# Patient Record
Sex: Female | Born: 1980 | Race: Black or African American | Hispanic: No | Marital: Single | Smoking: Never smoker
Health system: Southern US, Community
[De-identification: ages and names within clinical notes are randomized; demographics above are authoritative.]

## PROBLEM LIST (undated history)

## (undated) DIAGNOSIS — D281 Benign neoplasm of vagina: Secondary | ICD-10-CM

## (undated) DIAGNOSIS — D649 Anemia, unspecified: Secondary | ICD-10-CM

## (undated) DIAGNOSIS — G43909 Migraine, unspecified, not intractable, without status migrainosus: Secondary | ICD-10-CM

## (undated) DIAGNOSIS — R7303 Prediabetes: Secondary | ICD-10-CM

## (undated) HISTORY — PX: DIAGNOSTIC LAPAROSCOPY: SUR761

## (undated) HISTORY — PX: WISDOM TOOTH EXTRACTION: SHX21

---

## 2009-03-13 ENCOUNTER — Inpatient Hospital Stay (HOSPITAL_COMMUNITY): Admission: AD | Admit: 2009-03-13 | Discharge: 2009-03-13 | Payer: Self-pay | Admitting: Obstetrics & Gynecology

## 2009-03-16 ENCOUNTER — Inpatient Hospital Stay (HOSPITAL_COMMUNITY): Admission: RE | Admit: 2009-03-16 | Discharge: 2009-03-16 | Payer: Self-pay | Admitting: Obstetrics & Gynecology

## 2009-03-16 ENCOUNTER — Ambulatory Visit: Payer: Self-pay | Admitting: Obstetrics & Gynecology

## 2009-03-20 ENCOUNTER — Encounter: Payer: Self-pay | Admitting: Obstetrics & Gynecology

## 2009-03-20 ENCOUNTER — Observation Stay (HOSPITAL_COMMUNITY): Admission: AD | Admit: 2009-03-20 | Discharge: 2009-03-21 | Payer: Self-pay | Admitting: Obstetrics & Gynecology

## 2009-03-20 ENCOUNTER — Ambulatory Visit: Payer: Self-pay | Admitting: Obstetrics & Gynecology

## 2009-03-23 ENCOUNTER — Inpatient Hospital Stay (HOSPITAL_COMMUNITY): Admission: AD | Admit: 2009-03-23 | Discharge: 2009-03-23 | Payer: Self-pay | Admitting: Obstetrics and Gynecology

## 2009-04-02 DEATH — deceased

## 2009-04-09 ENCOUNTER — Ambulatory Visit: Payer: Self-pay | Admitting: Obstetrics & Gynecology

## 2009-04-09 LAB — CONVERTED CEMR LAB: Yeast Wet Prep HPF POC: NONE SEEN

## 2010-03-28 LAB — URINALYSIS, ROUTINE W REFLEX MICROSCOPIC
Bilirubin Urine: NEGATIVE
Glucose, UA: NEGATIVE mg/dL
Nitrite: NEGATIVE
Protein, ur: NEGATIVE mg/dL
Specific Gravity, Urine: 1.025 (ref 1.005–1.030)
pH: 6 (ref 5.0–8.0)

## 2010-03-28 LAB — HCG, QUANTITATIVE, PREGNANCY
hCG, Beta Chain, Quant, S: 2721 m[IU]/mL — ABNORMAL HIGH (ref <5)
hCG, Beta Chain, Quant, S: 4981 m[IU]/mL — ABNORMAL HIGH (ref <5)

## 2010-03-28 LAB — CBC
HCT: 32 % — ABNORMAL LOW (ref 36.0–46.0)
Hemoglobin: 10.4 g/dL — ABNORMAL LOW (ref 12.0–15.0)
MCHC: 32.8 g/dL (ref 30.0–36.0)
MCV: 87.5 fL (ref 78.0–100.0)
MCV: 87.5 fL (ref 78.0–100.0)
Platelets: 135 10*3/uL — ABNORMAL LOW (ref 150–400)
Platelets: 143 10*3/uL — ABNORMAL LOW (ref 150–400)
Platelets: 155 10*3/uL (ref 150–400)
RDW: 14.1 % (ref 11.5–15.5)
RDW: 14.5 % (ref 11.5–15.5)
RDW: 14.7 % (ref 11.5–15.5)
WBC: 7.3 10*3/uL (ref 4.0–10.5)

## 2010-03-28 LAB — CROSSMATCH

## 2010-03-28 LAB — POCT PREGNANCY, URINE: Preg Test, Ur: POSITIVE

## 2010-03-28 LAB — RH IMMUNE GLOBULIN WORKUP (NOT WOMEN'S HOSP): ABO/RH(D): AB NEG

## 2010-03-28 LAB — ABO/RH: ABO/RH(D): AB NEG

## 2010-03-28 LAB — DIFFERENTIAL
Basophils Relative: 1 % (ref 0–1)
Eosinophils Absolute: 0.1 10*3/uL (ref 0.0–0.7)
Lymphocytes Relative: 36 % (ref 12–46)
Lymphs Abs: 2.6 10*3/uL (ref 0.7–4.0)
Monocytes Relative: 6 % (ref 3–12)

## 2010-03-28 LAB — URINE MICROSCOPIC-ADD ON

## 2010-03-28 LAB — AST: AST: 18 U/L (ref 0–37)

## 2010-03-28 LAB — BUN: BUN: 9 mg/dL (ref 6–23)

## 2010-08-05 ENCOUNTER — Encounter (HOSPITAL_COMMUNITY): Payer: Self-pay | Admitting: *Deleted

## 2010-08-05 ENCOUNTER — Inpatient Hospital Stay (HOSPITAL_COMMUNITY)
Admission: AD | Admit: 2010-08-05 | Discharge: 2010-08-06 | Disposition: A | Discharge status: Home / Self Care | Payer: Self-pay | Source: Ambulatory Visit | Attending: Obstetrics & Gynecology | Admitting: Obstetrics & Gynecology

## 2010-08-05 DIAGNOSIS — M545 Low back pain, unspecified: Secondary | ICD-10-CM | POA: Insufficient documentation

## 2010-08-05 DIAGNOSIS — N949 Unspecified condition associated with female genital organs and menstrual cycle: Secondary | ICD-10-CM | POA: Insufficient documentation

## 2010-08-05 DIAGNOSIS — M549 Dorsalgia, unspecified: Secondary | ICD-10-CM | POA: Diagnosis present

## 2010-08-05 LAB — URINALYSIS, ROUTINE W REFLEX MICROSCOPIC
Bilirubin Urine: NEGATIVE
Hgb urine dipstick: NEGATIVE
Ketones, ur: NEGATIVE mg/dL
Specific Gravity, Urine: >1.03 — ABNORMAL HIGH (ref 1.005–1.030)
Urobilinogen, UA: 1 mg/dL (ref 0.0–1.0)

## 2010-08-05 LAB — CBC
HCT: 36.4 % (ref 36.0–46.0)
MCH: 28.4 pg (ref 26.0–34.0)
MCV: 83.5 fL (ref 78.0–100.0)
RBC: 4.36 MIL/uL (ref 3.87–5.11)
WBC: 6.3 10*3/uL (ref 4.0–10.5)

## 2010-08-05 MED ORDER — HYDROMORPHONE HCL 1 MG/ML IJ SOLN
1.0000 mg | Freq: Once | INTRAMUSCULAR | Status: AC
  Administered 2010-08-05: 1 mg via INTRAMUSCULAR

## 2010-08-05 MED ORDER — HYDROMORPHONE HCL 1 MG/ML IJ SOLN
INTRAMUSCULAR | Status: AC
  Filled 2010-08-05: qty 1

## 2010-08-05 NOTE — Progress Notes (Signed)
SAYS PAIN STARTED THIS AM AND HAS GOTTEN WORSE- HIPS TO LEGS. NO BLEEDING.   NO PAIN MED . NO BIRTH CONTROL. NO DIARRHEA.

## 2010-08-05 NOTE — Progress Notes (Signed)
Pt states. " Yesterday I started having pain in low back and today, and when I woke up with it in my left lower abd and it goes down into both legs. I had an ectopic last yr and I feel worse this time."

## 2010-08-05 NOTE — ED Provider Notes (Signed)
History    30 y.o.  Chief Complaint  Patient presents with  . Abdominal Pain  . Back Pain   HPI C/O left lower back and pelvic pain, radiates to hips and down legs bilaterally, severe. States she has intermittent sharp back pains as well, this has been ongoing since the birth of her child 4 years ago. Sometimes the pains make it difficult to walk. No vaginal bleeding. Expected period to start yesterday. Had surgery for ectopic pregnancy last year, states it feels like that pain. No vaginal bleeding or discharge, no n/v, dysuria, constipation or diarrhea.   OB History    Grav Para Term Preterm Abortions TAB SAB Ect Mult Living                  History reviewed. No pertinent past medical history.  Past Surgical History  Procedure Date  . Cesarean section     No family history on file.  History  Substance Use Topics  . Smoking status: Never Smoker   . Smokeless tobacco: Not on file  . Alcohol Use: No    Allergies: No Known Allergies  No prescriptions prior to admission    Review of Systems  Constitutional: Negative.   Respiratory: Negative.   Cardiovascular: Negative.   Gastrointestinal: Positive for abdominal pain. Negative for nausea, vomiting, diarrhea and constipation.  Genitourinary: Negative for dysuria, urgency, frequency, hematuria and flank pain.       Negative for vaginal bleeding, + for pelvic pain   Musculoskeletal: Positive for back pain.       + hip and leg pain   Neurological: Negative.   Psychiatric/Behavioral: Negative.    Physical Exam   Blood pressure 117/67, pulse 83, temperature 99.4 F (37.4 C), temperature source Oral, resp. rate 20, height 5' 3.25" (1.607 m), weight 83.122 kg (183 lb 4 oz), last menstrual period 07/04/2010.  Physical Exam  Nursing note and vitals reviewed. Constitutional: She is oriented to person, place, and time. She appears well-developed and well-nourished. She appears distressed.  Cardiovascular: Normal rate.     Respiratory: Effort normal.  GI: Soft. She exhibits no distension and no mass. There is tenderness. There is no rebound, no guarding and no CVA tenderness.  Genitourinary: There is no rash or lesion on the right labia. There is no rash or lesion on the left labia. Uterus is enlarged (masses palpable on right side) and tender. Uterus is not fixed. Cervix exhibits no motion tenderness and no discharge. Right adnexum displays tenderness. Right adnexum displays no mass and no fullness. Left adnexum displays tenderness. Left adnexum displays no mass and no fullness. No tenderness or bleeding around the vagina. Vaginal discharge (malodorous white) found.  Neurological: She is alert and oriented to person, place, and time.  Skin: Skin is warm and dry.  Psychiatric: She has a normal mood and affect.    MAU Course  Procedures Results for orders placed during the hospital encounter of 08/05/10 (from the past 24 hour(s))  URINALYSIS, ROUTINE W REFLEX MICROSCOPIC     Status: Abnormal   Collection Time   08/05/10 10:23 PM      Component Value Range   Color, Urine YELLOW  YELLOW    Appearance CLEAR  CLEAR    Specific Gravity, Urine >1.030 (*) 1.005 - 1.030    pH 6.0  5.0 - 8.0    Glucose, UA NEGATIVE  NEGATIVE (mg/dL)   Hgb urine dipstick NEGATIVE  NEGATIVE    Bilirubin Urine NEGATIVE  NEGATIVE  Ketones, ur NEGATIVE  NEGATIVE (mg/dL)   Protein, ur NEGATIVE  NEGATIVE (mg/dL)   Urobilinogen, UA 1.0  0.0 - 1.0 (mg/dL)   Nitrite NEGATIVE  NEGATIVE    Leukocytes, UA NEGATIVE  NEGATIVE   POCT PREGNANCY, URINE     Status: Normal   Collection Time   08/05/10 10:29 PM      Component Value Range   Preg Test, Ur NEGATIVE    CBC     Status: Normal   Collection Time   08/05/10 11:15 PM      Component Value Range   WBC 6.3  4.0 - 10.5 (K/uL)   RBC 4.36  3.87 - 5.11 (MIL/uL)   Hemoglobin 12.4  12.0 - 15.0 (g/dL)   HCT 16.1  09.6 - 04.5 (%)   MCV 83.5  78.0 - 100.0 (fL)   MCH 28.4  26.0 - 34.0 (pg)   MCHC  34.1  30.0 - 36.0 (g/dL)   RDW 40.9  81.1 - 91.4 (%)   Platelets 152  150 - 400 (K/uL)  WET PREP, GENITAL     Status: Abnormal   Collection Time   08/06/10  1:10 AM      Component Value Range   Yeast, Wet Prep FEW (*) NONE SEEN    Trich, Wet Prep NONE SEEN  NONE SEEN    Clue Cells, Wet Prep NONE SEEN  NONE SEEN    WBC, Wet Prep HPF POC MANY (*) NONE SEEN     US Transvaginal Non-ob  08/06/2010  *RADIOLOGY REPORT*  Clinical Data: Pelvic pain  TRANSABDOMINAL AND TRANSVAGINAL ULTRASOUND OF PELVIS Technique:  Both transabdominal and transvaginal ultrasound examinations of the pelvis were performed. Transabdominal technique was performed for global imaging of the pelvis including uterus, ovaries, adnexal regions, and pelvic cul-de-sac.  Comparison: 03/23/2009   It was necessary to proceed with endovaginal exam following the transabdominal exam to visualize the endometrium.  Findings:  Uterus: Measures 9.8 x 7.5 x 7.1 cm.  5.5 x 4.5 x 4.2 cm fibroid in the right uterine fundus.  2.5 x 2.2 x 2.5 cm fibroid in the right uterine body.  Endometrium: Normal in thickness and appearance, measuring 8 mm  Right ovary:  Normal appearance/no adnexal mass, measuring 2.8 x 1.8 x 1.7 cm  Left ovary: Normal appearance/no adnexal mass, measuring 4.1 x 1.8 x 2.2 cm, best visualized transabdominally  Other findings: Small amount of free fluid  IMPRESSION: Two right uterine fibroids, grossly unchanged.  Normal sonographic appearance of the bilateral ovaries.  Original Report Authenticated By: Charline Bills, M.D.       Assessment and Plan  Back pain - likely musculoskeletal in origin, not gyn origin, no change in fibroids from prior exam Encourage f/u with primary care provider - resource guide given Comfort measures rev'd   Nathali, Vent  Home Medication Instructions [id number]   Printed on:08/06/10 0244  Medication Information                    oxyCODONE-acetaminophen (ROXICET) 5-325 MG per  tablet Take 1 tablet by mouth every 6 (six) hours as needed for pain.           cyclobenzaprine (FLEXERIL) 10 MG tablet Take 1 tablet (10 mg total) by mouth 3 (three) times daily as needed for muscle spasms.           naproxen (NAPROSYN) 500 MG tablet Take 1 tablet (500 mg total) by mouth 2 (two) times daily with a meal.  Mavis Gravelle 08/06/2010, 1:22 AM

## 2010-08-05 NOTE — Progress Notes (Signed)
HAD SURGERY HERE LAST YEAR IN MARCH - ECTOPIC

## 2010-08-06 ENCOUNTER — Inpatient Hospital Stay (HOSPITAL_COMMUNITY): Payer: Self-pay

## 2010-08-06 DIAGNOSIS — M549 Dorsalgia, unspecified: Secondary | ICD-10-CM | POA: Diagnosis present

## 2010-08-06 LAB — WET PREP, GENITAL
Clue Cells Wet Prep HPF POC: NONE SEEN
Trich, Wet Prep: NONE SEEN

## 2010-08-06 MED ORDER — CYCLOBENZAPRINE HCL 10 MG PO TABS
10.0000 mg | ORAL_TABLET | Freq: Once | ORAL | Status: AC
  Administered 2010-08-06: 10 mg via ORAL
  Filled 2010-08-06: qty 1

## 2010-08-06 MED ORDER — OXYCODONE-ACETAMINOPHEN 5-325 MG PO TABS: 1.0000 | ORAL_TABLET | Freq: Four times a day (QID) | ORAL | Status: DC | PRN

## 2010-08-06 MED ORDER — NAPROXEN 500 MG PO TABS: 500.0000 mg | ORAL_TABLET | Freq: Two times a day (BID) | ORAL | Status: DC

## 2010-08-06 MED ORDER — CYCLOBENZAPRINE HCL 10 MG PO TABS: 10.0000 mg | ORAL_TABLET | Freq: Three times a day (TID) | ORAL | Status: AC | PRN

## 2010-08-06 NOTE — Progress Notes (Signed)
FRAZIER, CNM AT BEDSIDE- COLLECTED WET PREP AND CX.

## 2011-01-09 ENCOUNTER — Encounter (HOSPITAL_COMMUNITY): Payer: Self-pay | Admitting: *Deleted

## 2011-01-09 ENCOUNTER — Emergency Department (HOSPITAL_COMMUNITY)
Admission: EM | Admit: 2011-01-09 | Discharge: 2011-01-09 | Disposition: A | Discharge status: Home / Self Care | Payer: Self-pay | Attending: Emergency Medicine | Admitting: Emergency Medicine

## 2011-01-09 DIAGNOSIS — M549 Dorsalgia, unspecified: Secondary | ICD-10-CM | POA: Insufficient documentation

## 2011-01-09 DIAGNOSIS — R197 Diarrhea, unspecified: Secondary | ICD-10-CM | POA: Insufficient documentation

## 2011-01-09 DIAGNOSIS — R109 Unspecified abdominal pain: Secondary | ICD-10-CM | POA: Insufficient documentation

## 2011-01-09 HISTORY — DX: Benign neoplasm of vagina: D28.1

## 2011-01-09 LAB — URINALYSIS, ROUTINE W REFLEX MICROSCOPIC
Bilirubin Urine: NEGATIVE
Glucose, UA: NEGATIVE mg/dL
Hgb urine dipstick: NEGATIVE
Ketones, ur: NEGATIVE mg/dL
Leukocytes, UA: NEGATIVE
Nitrite: NEGATIVE
Protein, ur: NEGATIVE mg/dL
Specific Gravity, Urine: 1.02 (ref 1.005–1.030)
Urobilinogen, UA: 0.2 mg/dL (ref 0.0–1.0)
pH: 6.5 (ref 5.0–8.0)

## 2011-01-09 LAB — WET PREP, GENITAL

## 2011-01-09 MED ORDER — KETOROLAC TROMETHAMINE 60 MG/2ML IM SOLN
60.0000 mg | Freq: Once | INTRAMUSCULAR | Status: AC
  Administered 2011-01-09: 60 mg via INTRAMUSCULAR
  Filled 2011-01-09: qty 2

## 2011-01-09 MED ORDER — IBUPROFEN 800 MG PO TABS: 800.0000 mg | ORAL_TABLET | Freq: Three times a day (TID) | ORAL | Status: AC | PRN

## 2011-01-09 MED ORDER — ACETAMINOPHEN-CODEINE #3 300-30 MG PO TABS: 1.0000 | ORAL_TABLET | Freq: Four times a day (QID) | ORAL | Status: AC | PRN

## 2011-01-09 NOTE — ED Provider Notes (Signed)
Shared visit with Oletta Lamas, MD. Pelvic exam performed by myself. Normal external genitalia. Vaginal mucosa pink and moist with clear/white watery discharge. No lesions seen. Cervical os closed with no CMT. No adnexal mass or tenderness. Uterus enlarged on palpation, consistent with hx of uterine fibroids.     Results for orders placed during the hospital encounter of 01/09/11  URINALYSIS, ROUTINE W REFLEX MICROSCOPIC      Component Value Range   Color, Urine YELLOW  YELLOW    APPearance CLEAR  CLEAR    Specific Gravity, Urine 1.020  1.005 - 1.030    pH 6.5  5.0 - 8.0    Glucose, UA NEGATIVE  NEGATIVE (mg/dL)   Hgb urine dipstick NEGATIVE  NEGATIVE    Bilirubin Urine NEGATIVE  NEGATIVE    Ketones, ur NEGATIVE  NEGATIVE (mg/dL)   Protein, ur NEGATIVE  NEGATIVE (mg/dL)   Urobilinogen, UA 0.2  0.0 - 1.0 (mg/dL)   Nitrite NEGATIVE  NEGATIVE    Leukocytes, UA NEGATIVE  NEGATIVE   POCT PREGNANCY, URINE      Component Value Range   Preg Test, Ur NEGATIVE    WET PREP, GENITAL      Component Value Range   Yeast, Wet Prep NONE SEEN  NONE SEEN    Trich, Wet Prep NONE SEEN  NONE SEEN    Clue Cells, Wet Prep NONE SEEN  NONE SEEN    WBC, Wet Prep HPF POC FEW (*) NONE SEEN     U/a and wet prep reviewed, no significant findings. Pt will be discharged home and is to follow-up with GYN for further evaluation as needed. Pt and family notified of plan and voice understanding. I will give her an rx for motrin for mold-moderate pain and Tylenol #3 for more severe pain.  Elwyn Reach [place], [place] 01/09/11 1900

## 2011-01-09 NOTE — ED Provider Notes (Signed)
History     CSN: [id number]  Arrival date & time 01/09/11  1214   First MD Initiated Contact with Patient 01/09/11 1539      Chief Complaint  Patient presents with  . Abdominal Pain    (Consider location/radiation/quality/duration/timing/severity/associated sxs/prior treatment) HPI Comments: Patient with pain since yesterday. Pain has been persistent. It waxes and wanes to a severe overall diffuse abdominal crampy sensation with slight radiation into her back. The pain would then ease off but the pain does not disappear. The severe episodes will last for 5-10 minutes, and then she'll have about 30 minutes of rest before the pain intensifies again. She relates this similar to pregnancy cramping pain. She reports that she has uterine fibroids which has given her problems in the past as far as pain and abnormal bleeding, however this feels somewhat different. She reports her last menses was December 17 that she is a little early for her menstrual period to start. She denies fevers or chills. No dysuria or hematuria. She denies any obvious sick contacts with GI symptoms. She reports no foreign travel, no recent antibiotic use. She also denies eating anything unusual recently. She reports she does not have an OB/GYN physician here locally.  Patient is a 31 y.o. female presenting with abdominal pain. The history is provided by the patient.  Abdominal Pain The primary symptoms of the illness include abdominal pain and diarrhea. The primary symptoms of the illness do not include fever, nausea, vomiting, dysuria, vaginal discharge or vaginal bleeding.  Additional symptoms associated with the illness include back pain. Symptoms associated with the illness do not include urgency or hematuria.    Past Medical History  Diagnosis Date  . Vaginal fibroids     pt reports hx of ABD pain with  fibroids    Past Surgical History  Procedure Date  . Cesarean section     No family history on  file.  History  Substance Use Topics  . Smoking status: Never Smoker   . Smokeless tobacco: Not on file  . Alcohol Use: No    OB History    Grav Para Term Preterm Abortions TAB SAB Ect Mult Living                  Review of Systems  Constitutional: Negative.  Negative for fever.  Gastrointestinal: Positive for abdominal pain and diarrhea. Negative for nausea, vomiting, blood in stool and abdominal distention.  Genitourinary: Negative for dysuria, urgency, hematuria, vaginal bleeding, vaginal discharge, menstrual problem and pelvic pain.  Musculoskeletal: Positive for back pain.  Skin: Negative for rash.    Allergies  Review of patient's allergies indicates no known allergies.  Home Medications   Current Outpatient Rx  Name Route Sig Dispense Refill  . OXYCODONE-ACETAMINOPHEN 5-325 MG PO TABS Oral Take 1 tablet by mouth every 6 (six) hours as needed. For pain       BP 108/59  Pulse 73  Temp(Src) 98.1 F (36.7 C) (Oral)  Resp 16  SpO2 99%  Physical Exam  Nursing note and vitals reviewed. Constitutional: She appears well-developed and well-nourished. No distress.  HENT:  Head: Normocephalic and atraumatic.  Eyes: Pupils are equal, round, and reactive to light.  Neck: Neck supple.  Cardiovascular: Normal rate.   Pulmonary/Chest: Effort normal and breath sounds normal.  Abdominal: Soft. She exhibits no distension. There is no tenderness. There is no rebound and no guarding.  Neurological: She is alert.  Skin: Skin is warm. No rash noted.  Psychiatric: She has a normal mood and affect.    ED Course  Procedures (including critical care time)   Labs Reviewed  URINALYSIS, ROUTINE W REFLEX MICROSCOPIC  POCT PREGNANCY, URINE  POCT PREGNANCY, URINE   No results found.   No diagnosis found.    MDM  I favor uterine cramping versus diffuse cramping associated with the diarrhea.  Will need pelvic exam.  Will treat with IM toradol and imodium for now.         Pt placed in CDU and seen by [facility] Artis Flock for further exam and reassesment,    Gavin Pound. Oletta Lamas, MD 01/10/11 [phone]

## 2011-01-09 NOTE — ED Notes (Signed)
All over abd pain since yesterday no n/v deneies vag d/d or dysuria

## 2011-01-10 NOTE — ED Provider Notes (Signed)
Medical screening examination/treatment/procedure(s) were conducted as a shared visit with non-physician practitioner(s) and myself.  I personally evaluated the patient during the encounter See my prior note. Kimani Bedoya Y.   Gavin Pound. Oletta Lamas, MD 01/10/11 [phone]

## 2011-07-20 ENCOUNTER — Inpatient Hospital Stay (HOSPITAL_COMMUNITY)
Admission: AD | Admit: 2011-07-20 | Discharge: 2011-07-21 | Disposition: A | Discharge status: Home / Self Care | Payer: Self-pay | Source: Ambulatory Visit | Attending: Family Medicine | Admitting: Family Medicine

## 2011-07-20 ENCOUNTER — Encounter (HOSPITAL_COMMUNITY): Payer: Self-pay | Admitting: *Deleted

## 2011-07-20 DIAGNOSIS — D219 Benign neoplasm of connective and other soft tissue, unspecified: Secondary | ICD-10-CM

## 2011-07-20 DIAGNOSIS — B9689 Other specified bacterial agents as the cause of diseases classified elsewhere: Secondary | ICD-10-CM | POA: Insufficient documentation

## 2011-07-20 DIAGNOSIS — R102 Pelvic and perineal pain: Secondary | ICD-10-CM

## 2011-07-20 DIAGNOSIS — N760 Acute vaginitis: Secondary | ICD-10-CM | POA: Insufficient documentation

## 2011-07-20 DIAGNOSIS — A499 Bacterial infection, unspecified: Secondary | ICD-10-CM | POA: Insufficient documentation

## 2011-07-20 DIAGNOSIS — R109 Unspecified abdominal pain: Secondary | ICD-10-CM | POA: Insufficient documentation

## 2011-07-20 DIAGNOSIS — N949 Unspecified condition associated with female genital organs and menstrual cycle: Secondary | ICD-10-CM | POA: Insufficient documentation

## 2011-07-20 DIAGNOSIS — D259 Leiomyoma of uterus, unspecified: Secondary | ICD-10-CM | POA: Insufficient documentation

## 2011-07-20 HISTORY — DX: Anemia, unspecified: D64.9

## 2011-07-20 LAB — URINALYSIS, ROUTINE W REFLEX MICROSCOPIC
Bilirubin Urine: NEGATIVE
Glucose, UA: NEGATIVE mg/dL
Ketones, ur: NEGATIVE mg/dL
Protein, ur: NEGATIVE mg/dL

## 2011-07-20 LAB — POCT PREGNANCY, URINE: Preg Test, Ur: NEGATIVE

## 2011-07-20 NOTE — MAU Provider Note (Signed)
History     CSN: [id number]  Arrival date and time: 07/20/11 2201   First Provider Initiated Contact with Patient 07/20/11 2311      Chief Complaint  Patient presents with  . Abdominal Pain   HPI Eileen Patel is a 31 y.o. female who presents to MAU for vaginal pain and bleeding. The pain started yesterday. She describes the pain sharp that comes and goes. She rates the pain as 5/10. LMP 07/05/11 and then started bleeding again today but none now. Last pap smear was one year ago and was normal in the [facility]. The history was provided by the patient.  OB History    Grav Para Term Preterm Abortions TAB SAB Ect Mult Living   6 1 1  0 5 0 5 0 0 1      Past Medical History  Diagnosis Date  . Vaginal fibroids     pt reports hx of ABD pain with  fibroids  . Anemia     Past Surgical History  Procedure Date  . Cesarean section     History reviewed. No pertinent family history.  History  Substance Use Topics  . Smoking status: Never Smoker   . Smokeless tobacco: Not on file  . Alcohol Use: No    Allergies: No Known Allergies  No prescriptions prior to admission    Review of Systems  Constitutional: Negative for fever, chills and weight loss.  HENT: Negative for ear pain, nosebleeds, congestion, sore throat and neck pain.   Eyes: Negative for blurred vision, double vision, photophobia and pain.  Respiratory: Negative for cough, shortness of breath and wheezing.   Cardiovascular: Negative for chest pain, palpitations and leg swelling.  Gastrointestinal: Positive for heartburn and nausea. Negative for vomiting, abdominal pain, diarrhea and constipation.  Genitourinary: Negative for dysuria, urgency and frequency.  Musculoskeletal: Positive for back pain. Negative for myalgias.  Skin: Negative for itching and rash.  Neurological: Positive for dizziness and headaches. Negative for sensory change, speech change, seizures and weakness.  Endo/Heme/Allergies: Does not  bruise/bleed easily.  Psychiatric/Behavioral: Negative for depression. The patient is not nervous/anxious.    Last menstrual period 07/05/2011.  Physical Exam  Nursing note and vitals reviewed. Constitutional: She is oriented to person, place, and time. She appears well-developed and well-nourished. No distress.  HENT:  Head: Normocephalic and atraumatic.  Eyes: EOM are normal.  Neck: Neck supple.  Cardiovascular: Normal rate.   Respiratory: Effort normal.  GI: Soft. There is tenderness in the right lower quadrant and left lower quadrant. There is no rigidity, no rebound, no guarding and no CVA tenderness.  Genitourinary: Vagina normal.       External genitalia without lesions. Brown discharge vaginal vault. Cervix without lesions. Right adnexal tenderness.   Musculoskeletal: Normal range of motion.  Neurological: She is alert and oriented to person, place, and time.  Skin: Skin is warm and dry.  Psychiatric: She has a normal mood and affect. Her behavior is normal. Judgment and thought content normal.   Results for orders placed during the hospital encounter of 07/20/11 (from the past 24 hour(s))  URINALYSIS, ROUTINE W REFLEX MICROSCOPIC     Status: Abnormal   Collection Time   07/20/11 10:27 PM      Component Value Range   Color, Urine YELLOW  YELLOW   APPearance CLEAR  CLEAR   Specific Gravity, Urine >1.030 (*) 1.005 - 1.030   pH 6.0  5.0 - 8.0   Glucose, UA NEGATIVE  NEGATIVE  mg/dL   Hgb urine dipstick TRACE (*) NEGATIVE   Bilirubin Urine NEGATIVE  NEGATIVE   Ketones, ur NEGATIVE  NEGATIVE mg/dL   Protein, ur NEGATIVE  NEGATIVE mg/dL   Urobilinogen, UA 0.2  0.0 - 1.0 mg/dL   Nitrite NEGATIVE  NEGATIVE   Leukocytes, UA NEGATIVE  NEGATIVE  URINE MICROSCOPIC-ADD ON     Status: Normal   Collection Time   07/20/11 10:27 PM      Component Value Range   Squamous Epithelial / LPF RARE  RARE   RBC / HPF 0-2  <3 RBC/hpf   Urine-Other MUCOUS PRESENT    POCT PREGNANCY, URINE      Status: Normal   Collection Time   07/20/11 11:23 PM      Component Value Range   Preg Test, Ur NEGATIVE  NEGATIVE  WET PREP, GENITAL     Status: Abnormal   Collection Time   07/21/11 12:40 AM      Component Value Range   Yeast Wet Prep HPF POC NONE SEEN  NONE SEEN   Trich, Wet Prep NONE SEEN  NONE SEEN   Clue Cells Wet Prep HPF POC MODERATE (*) NONE SEEN   WBC, Wet Prep HPF POC FEW (*) NONE SEEN   US Transvaginal Non-ob  07/21/2011  *RADIOLOGY REPORT*  Clinical Data: Right-sided pelvic pain, vaginal bleeding.  History of uterine fibroids  TRANSABDOMINAL AND TRANSVAGINAL ULTRASOUND OF PELVIS Technique:  Both transabdominal and transvaginal ultrasound examinations of the pelvis were performed. Transabdominal technique was performed for global imaging of the pelvis including uterus, ovaries, adnexal regions, and pelvic cul-de-sac.  It was necessary to proceed with endovaginal exam following the transabdominal exam to visualize the right ovary, not seen transabdominally.  Comparison:  08/06/2010  Findings:  Uterus: Anteverted, anteflexed.  The right lateral intramural peripherally calcified fibroid measures 5.4 x 4.4 x 4.5 cm, unchanged.  Anterior lower uterine segment intramural fibroid measures 3.9 x 3.7 x 3.4 cm.  Left posterolateral intramural uterine body fibroid measures 2.9 x 2.9 x 2.1 cm  Endometrium: 1.8 cm.  Partly obscured by overlying fibroids but uniformly echogenic where visualized.  Right ovary:  4.1 x 4.0 x 2.7 cm.  Normal.  Left ovary: 4.0 x 3.4 x 2.2 cm.  Normal.  Other findings: Trace free fluid in the left adnexa noted.  IMPRESSION: Uterine fibroids as above.  No acute abnormality. No significant change, allowing for differences in technique.  Original Report Authenticated By: Harrel Lemon, M.D.   US Pelvis Complete  07/21/2011  *RADIOLOGY REPORT*  Clinical Data: Right-sided pelvic pain, vaginal bleeding.  History of uterine fibroids  TRANSABDOMINAL AND TRANSVAGINAL ULTRASOUND  OF PELVIS Technique:  Both transabdominal and transvaginal ultrasound examinations of the pelvis were performed. Transabdominal technique was performed for global imaging of the pelvis including uterus, ovaries, adnexal regions, and pelvic cul-de-sac.  It was necessary to proceed with endovaginal exam following the transabdominal exam to visualize the right ovary, not seen transabdominally.  Comparison:  08/06/2010  Findings:  Uterus: Anteverted, anteflexed.  The right lateral intramural peripherally calcified fibroid measures 5.4 x 4.4 x 4.5 cm, unchanged.  Anterior lower uterine segment intramural fibroid measures 3.9 x 3.7 x 3.4 cm.  Left posterolateral intramural uterine body fibroid measures 2.9 x 2.9 x 2.1 cm  Endometrium: 1.8 cm.  Partly obscured by overlying fibroids but uniformly echogenic where visualized.  Right ovary:  4.1 x 4.0 x 2.7 cm.  Normal.  Left ovary: 4.0 x 3.4 x 2.2 cm.  Normal.  Other findings: Trace free fluid in the left adnexa noted.  IMPRESSION: Uterine fibroids as above.  No acute abnormality. No significant change, allowing for differences in technique.  Original Report Authenticated By: Harrel Lemon, M.D.   Assessment: Uterine fibroids   Pelvic pain   Bacterial vaginosis  Plan:  Ibuprofen Rx   Flagyl Rx   Follow up in GYN Clinic   Return here as needed  I have reviewed this patient's vital signs, nurses notes, appropriate labs and imaging. I have discussed the results of ultrasound and labs with the patient and plan of care. Patient voices understanding.  MAU Course  Procedures      NEESE,HOPE, RN, FNP, [facility] 07/21/2011, 2:42 AM

## 2011-07-20 NOTE — Progress Notes (Signed)
Pt states she had a lot of bleeding with clots yesterday

## 2011-07-20 NOTE — Progress Notes (Signed)
Pt states she is feeling" uncomfortable, weak and dizzy " from the pain she is having in her thighs

## 2011-07-20 NOTE — MAU Note (Signed)
Pt states she had a miscarriage about 2 yrs ago and pt feel like something  "isnt right in there"

## 2011-07-21 ENCOUNTER — Inpatient Hospital Stay (HOSPITAL_COMMUNITY): Payer: Self-pay

## 2011-07-21 LAB — GC/CHLAMYDIA PROBE AMP, GENITAL
Chlamydia, DNA Probe: NEGATIVE
GC Probe Amp, Genital: NEGATIVE

## 2011-07-21 LAB — WET PREP, GENITAL
Trich, Wet Prep: NONE SEEN
Yeast Wet Prep HPF POC: NONE SEEN

## 2011-07-21 MED ORDER — IBUPROFEN 600 MG PO TABS: 600.0000 mg | ORAL_TABLET | Freq: Four times a day (QID) | ORAL | Status: AC | PRN

## 2011-07-21 MED ORDER — METRONIDAZOLE 500 MG PO TABS: 500.0000 mg | ORAL_TABLET | Freq: Two times a day (BID) | ORAL | Status: AC

## 2011-07-21 NOTE — MAU Provider Note (Signed)
Chart reviewed and agree with management and plan.  

## 2011-07-21 NOTE — Progress Notes (Signed)
Pt did experience some tenderness on pelvic exam.

## 2012-07-10 ENCOUNTER — Other Ambulatory Visit: Payer: Self-pay | Admitting: Obstetrics and Gynecology

## 2012-07-26 ENCOUNTER — Other Ambulatory Visit: Payer: Self-pay | Admitting: Obstetrics and Gynecology

## 2012-07-26 DIAGNOSIS — IMO0002 Reserved for concepts with insufficient information to code with codable children: Secondary | ICD-10-CM

## 2012-08-02 ENCOUNTER — Ambulatory Visit (HOSPITAL_COMMUNITY)
Admission: RE | Admit: 2012-08-02 | Discharge: 2012-08-02 | Disposition: A | Discharge status: Home / Self Care | Payer: BC Managed Care – PPO | Source: Ambulatory Visit | Attending: Obstetrics and Gynecology | Admitting: Obstetrics and Gynecology

## 2012-08-02 DIAGNOSIS — N979 Female infertility, unspecified: Secondary | ICD-10-CM | POA: Insufficient documentation

## 2012-08-02 DIAGNOSIS — IMO0002 Reserved for concepts with insufficient information to code with codable children: Secondary | ICD-10-CM

## 2012-08-02 MED ORDER — IOHEXOL 300 MG/ML  SOLN
10.0000 mL | Freq: Once | INTRAMUSCULAR | Status: AC | PRN
  Administered 2012-08-02: 10 mL

## 2012-09-18 ENCOUNTER — Other Ambulatory Visit: Payer: Self-pay | Admitting: Obstetrics and Gynecology

## 2012-09-24 ENCOUNTER — Other Ambulatory Visit: Payer: Self-pay | Admitting: Obstetrics and Gynecology

## 2012-09-24 ENCOUNTER — Encounter (HOSPITAL_COMMUNITY): Payer: Self-pay | Admitting: Pharmacist

## 2012-09-24 NOTE — H&P (Signed)
Eileen Patel is a 32 y.o. female  P 1-0-5-1, presents for myomectomy because of symptomatic uterine fibroids, menorrhagia and dysmenorrhea. Since age 61 the patient has had heavy periods characterized by a 7 day flow with heavy flow pad change 6 times a day.  Her menstrual cramping requires narcotic analgesia and will only reduce her cramping to 9/10 on a 10 point pain scale from 10/10.  She denies any inter-menstrual bleeding, dyspareunia, changes in bowel or bladder habits.  By choice, she has not received any medical management for her symptoms in the past. A TSH and CBC in June 2014 were normal except that her hemoglobin was 11.1.    A pelvic ultrasound at that same time showed: uterus-8.38 x 8.44 x 8.41 cm with endometrium-8.51 mm; #4 intramural fibroids:  right anterior-5.92 x 4.01 x 4.29 cm;   right anterior- 3.85 x 4.86 x 3.69 cm;  posterior (displacing endometrium anteriorly) 2.80 x 2.92 x 2.48 cm and    posterior- 2.36 x 1.99 x 2.38 cm;   left ovary-3.20 x 2.96 x 2.13 cm and right ovary- 3.23 x 3.13 x 1.43 cm.  A review of both medical and surgical management options were given to the patient regarding  fibroids and symptom treatment,  however, she wants to proceed with a robot assisted myomectomy.  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 ferrous sulfate 325 (65 FE) MG tablet Take 325 mg by mouth daily with  breakfast.      . folic acid (FOLVITE) 400 MCG tablet Take 400 mcg by mouth daily.      Marland Kitchen oxycodone (OXY-IR) 5 MG capsule Take 5 mg by mouth every 4 (four) hours as needed for pain.       No facility-administered encounter medications on file as of 09/24/2012.    No Known Allergies  Denies sensitivity to peanuts, shellfish, soy, latex or adhesives.   ROS: Denies headache, vision changes, nasal congestion, dysphagia, tinnitus, dizziness, hoarseness, cough,  chest pain, shortness of breath, nausea, vomiting, diarrhea,constipation,  urinary frequency, urgency  dysuria, hematuria, vaginitis symptoms, swelling of joints,easy bruising,  myalgias, arthralgias, skin rashes, unexplained weight loss and except as is mentioned in the history of present illness, patient's review of systems is otherwise negative.   Physical Exam  Bp  118/66   P 100    R  14    Temperature  99.0 degrees F orally   Weight  199 lbs   Height  5'4"  Neck: supple without masses or thyromegaly Lungs: clear to auscultation Heart: regular rate and rhythm Abdomen: soft, non-tender and no organomegaly Pelvic:EGBUS- wnl; vagina-normal rugae; uterus-10-12 weeks size, irregular, cervix without lesions or motion tenderness; adnexae-no tenderness or masses Extremities:  no clubbing, cyanosis or edema   Assesment:  Symptomatic Uterine Fibroids  Menorrhagia                        Dysmenorrhea   Disposition:  Robotically-assisted myomectomy was reviewed with the patient.  Benefits of the robotic approach include lesser postoperative pain, less blood loss during surgery, reduced risk of injury to other organs due to better visualization (with a 3-D HD) 10 times magnifying camera, shorter hospital stay between 0-1 night and rapid recovery with return to daily routine in 2-3 weeks. Although robotically-assisted myomectomy has a longer operative time than traditional laparotomy, in a patient with good medical  history, the benefits usually outweigh the risks.  Risks include but are not limited to bleeding, infection, injury to other organs, need for laparotomy and transient post-operative facial edema.   Patient informed about FDA warning on use of morcellator dated 04/18/2012.  Discussed:  1. Incidence of post-operative diagnosis of sarcoma in women undergoing a hysterectomy is 2:1000 2. Annual incidence of leiomyosarcoma is 0.64/100,000 women 3. Sarcomas have the highest incidence in women over 65 4. Power morcellation involves risks of spreading tissue / disease. In he case of undiagnosed cancer, it may adversely affect the patient's prognosis.  Also discussed:  1.Lack of tactile feedback with the robotic approach leaving non-visible fibroids impossible to resect with potential future growth.  2. Need for cesarean section if there is uterine cavity entry and/or multiple incisions  3. Risk of developing NEW fibroids estimated at 25%  Further discussion was held with patient regarding the indication for her procedure(s) along with the risks, which include but are not limited to: reaction to anesthesia, and possible need for C-section if the endometrial cavity is breached.  Patient also understands that she is expected to return to regular activities in 2-3 weeks (with the exception of intercourse which will be 6 weeks).  The patient was given the Miralax bowel prep to be completed 24 hours prior to her surgery.  The patient verbalized understanding of these risks and pre-operative instructions and has consented to proceed with a Robot -Assisted Myomectomy with Possible Right Salpingectomy at [facility] [place] on October 08, 2012.    CSN# [id number]   Yaphet Smethurst J. Lowell Guitar, PA-C  for Dr. Crist Fat. Rivard

## 2012-09-30 NOTE — Patient Instructions (Addendum)
   Your procedure is scheduled on: Thursday, 10/10/12  Enter through the Main Entrance of [facility] at:  6 am Pick up the phone at the desk and dial [phone] and inform us of your arrival.  Please call this number if you have any problems the morning of surgery: [phone]  Remember: Do not eat or drink after midnight:  Wednesday Take these medicines the morning of surgery with a SIP OF WATER:  None  Do not wear jewelry, make-up, or FINGER nail polish No metal in your hair or on your body. Do not wear lotions, powders, perfumes. You may wear deodorant.  Please use your CHG wash as directed prior to surgery.  Do not shave anywhere for at least 12 hours prior to first CHG shower.  Do not bring valuables to the hospital. Contacts, dentures or bridgework may not be worn into surgery.  Leave suitcase in the car. After Surgery it may be brought to your room. For patients being admitted to the hospital, checkout time is 11:00am the day of discharge.  Patients discharged on the day of surgery will not be allowed to drive home.  Home with mother [place] or Psychologist, clinical.

## 2012-10-01 ENCOUNTER — Encounter (HOSPITAL_COMMUNITY): Payer: Self-pay

## 2012-10-01 ENCOUNTER — Other Ambulatory Visit (HOSPITAL_COMMUNITY): Payer: BC Managed Care – PPO

## 2012-10-01 ENCOUNTER — Encounter (HOSPITAL_COMMUNITY)
Admission: RE | Admit: 2012-10-01 | Discharge: 2012-10-01 | Disposition: A | Discharge status: Home / Self Care | Payer: BC Managed Care – PPO | Source: Ambulatory Visit | Attending: Obstetrics and Gynecology | Admitting: Obstetrics and Gynecology

## 2012-10-01 DIAGNOSIS — Z01818 Encounter for other preprocedural examination: Secondary | ICD-10-CM | POA: Insufficient documentation

## 2012-10-01 DIAGNOSIS — Z01812 Encounter for preprocedural laboratory examination: Secondary | ICD-10-CM | POA: Insufficient documentation

## 2012-10-01 LAB — BASIC METABOLIC PANEL
CO2: 25 mEq/L (ref 19–32)
Calcium: 8.6 mg/dL (ref 8.4–10.5)
Chloride: 108 mEq/L (ref 96–112)
Glucose, Bld: 110 mg/dL — ABNORMAL HIGH (ref 70–99)
Sodium: 141 mEq/L (ref 135–145)

## 2012-10-01 LAB — CBC
HCT: 32.5 % — ABNORMAL LOW (ref 36.0–46.0)
MCH: 26.1 pg (ref 26.0–34.0)
MCV: 79.3 fL (ref 78.0–100.0)
Platelets: 170 10*3/uL (ref 150–400)
RBC: 4.1 MIL/uL (ref 3.87–5.11)

## 2012-10-01 LAB — TYPE AND SCREEN

## 2012-10-02 ENCOUNTER — Other Ambulatory Visit (HOSPITAL_COMMUNITY): Payer: BC Managed Care – PPO

## 2012-10-09 MED ORDER — DEXTROSE 5 % IV SOLN
2.0000 g | INTRAVENOUS | Status: AC
  Administered 2012-10-10: 2 g via INTRAVENOUS
  Filled 2012-10-09: qty 2

## 2012-10-10 ENCOUNTER — Encounter (HOSPITAL_COMMUNITY): Payer: Self-pay | Admitting: *Deleted

## 2012-10-10 ENCOUNTER — Encounter (HOSPITAL_COMMUNITY): Payer: BC Managed Care – PPO | Admitting: Anesthesiology

## 2012-10-10 ENCOUNTER — Encounter (HOSPITAL_COMMUNITY)
Admission: RE | Disposition: A | Discharge status: Home / Self Care | Payer: Self-pay | Source: Ambulatory Visit | Attending: Obstetrics and Gynecology

## 2012-10-10 ENCOUNTER — Ambulatory Visit (HOSPITAL_COMMUNITY): Payer: BC Managed Care – PPO | Admitting: Anesthesiology

## 2012-10-10 ENCOUNTER — Ambulatory Visit (HOSPITAL_COMMUNITY)
Admission: RE | Admit: 2012-10-10 | Discharge: 2012-10-10 | Disposition: A | Discharge status: Home / Self Care | Payer: BC Managed Care – PPO | Source: Ambulatory Visit | Attending: Obstetrics and Gynecology | Admitting: Obstetrics and Gynecology

## 2012-10-10 DIAGNOSIS — D251 Intramural leiomyoma of uterus: Secondary | ICD-10-CM | POA: Insufficient documentation

## 2012-10-10 DIAGNOSIS — N946 Dysmenorrhea, unspecified: Secondary | ICD-10-CM | POA: Insufficient documentation

## 2012-10-10 DIAGNOSIS — N920 Excessive and frequent menstruation with regular cycle: Secondary | ICD-10-CM | POA: Insufficient documentation

## 2012-10-10 HISTORY — PX: ROBOT ASSISTED MYOMECTOMY: SHX5142

## 2012-10-10 LAB — TYPE AND SCREEN
ABO/RH(D): AB NEG
Antibody Screen: NEGATIVE

## 2012-10-10 LAB — PREGNANCY, URINE: Preg Test, Ur: NEGATIVE

## 2012-10-10 SURGERY — ROBOTIC ASSISTED MYOMECTOMY
Anesthesia: General | Site: Abdomen | Wound class: Clean Contaminated

## 2012-10-10 MED ORDER — ARTIFICIAL TEARS OP OINT
TOPICAL_OINTMENT | OPHTHALMIC | Status: AC
  Filled 2012-10-10: qty 3.5

## 2012-10-10 MED ORDER — ROCURONIUM BROMIDE 50 MG/5ML IV SOLN
INTRAVENOUS | Status: AC
  Filled 2012-10-10: qty 1

## 2012-10-10 MED ORDER — KETOROLAC TROMETHAMINE 30 MG/ML IJ SOLN
INTRAMUSCULAR | Status: AC
  Filled 2012-10-10: qty 1

## 2012-10-10 MED ORDER — DEXAMETHASONE SODIUM PHOSPHATE 10 MG/ML IJ SOLN
INTRAMUSCULAR | Status: AC
  Filled 2012-10-10: qty 1

## 2012-10-10 MED ORDER — SODIUM CHLORIDE 0.9 % IJ SOLN
INTRAMUSCULAR | Status: AC
  Filled 2012-10-10: qty 50

## 2012-10-10 MED ORDER — OXYCODONE-ACETAMINOPHEN 5-325 MG PO TABS: 1.0000 | ORAL_TABLET | ORAL | Status: DC | PRN

## 2012-10-10 MED ORDER — MISOPROSTOL 100 MCG PO TABS
ORAL_TABLET | ORAL | Status: DC | PRN
  Administered 2012-10-10: 400 ug

## 2012-10-10 MED ORDER — MISOPROSTOL 200 MCG PO TABS
ORAL_TABLET | ORAL | Status: AC
  Filled 2012-10-10: qty 2

## 2012-10-10 MED ORDER — LACTATED RINGERS IV SOLN
INTRAVENOUS | Status: DC
  Administered 2012-10-10: 1000 mL via INTRAVENOUS
  Administered 2012-10-10: 09:00:00 via INTRAVENOUS

## 2012-10-10 MED ORDER — ONDANSETRON HCL 4 MG/2ML IJ SOLN
INTRAMUSCULAR | Status: DC | PRN
  Administered 2012-10-10: 4 mg via INTRAMUSCULAR

## 2012-10-10 MED ORDER — IBUPROFEN 600 MG PO TABS: 600.0000 mg | ORAL_TABLET | Freq: Four times a day (QID) | ORAL | Status: DC | PRN

## 2012-10-10 MED ORDER — FENTANYL CITRATE 0.05 MG/ML IJ SOLN
INTRAMUSCULAR | Status: AC
  Filled 2012-10-10: qty 5

## 2012-10-10 MED ORDER — LIDOCAINE HCL (CARDIAC) 20 MG/ML IV SOLN
INTRAVENOUS | Status: DC | PRN
  Administered 2012-10-10 (×2): 50 mg via INTRAVENOUS

## 2012-10-10 MED ORDER — ROCURONIUM BROMIDE 100 MG/10ML IV SOLN
INTRAVENOUS | Status: DC | PRN
  Administered 2012-10-10: 40 mg via INTRAVENOUS
  Administered 2012-10-10: 20 mg via INTRAVENOUS
  Administered 2012-10-10: 5 mg via INTRAVENOUS
  Administered 2012-10-10 (×2): 10 mg via INTRAVENOUS
  Administered 2012-10-10 (×2): 5 mg via INTRAVENOUS
  Administered 2012-10-10 (×3): 10 mg via INTRAVENOUS

## 2012-10-10 MED ORDER — VASOPRESSIN 20 UNIT/ML IJ SOLN
INTRAVENOUS | Status: DC | PRN
  Administered 2012-10-10: 08:00:00 via INTRAMUSCULAR

## 2012-10-10 MED ORDER — PROMETHAZINE HCL 25 MG/ML IJ SOLN: 6.2500 mg | INTRAMUSCULAR | Status: DC | PRN

## 2012-10-10 MED ORDER — MEPERIDINE HCL 25 MG/ML IJ SOLN: 6.2500 mg | INTRAMUSCULAR | Status: DC | PRN

## 2012-10-10 MED ORDER — LACTATED RINGERS IV SOLN: INTRAVENOUS | Status: DC

## 2012-10-10 MED ORDER — LACTATED RINGERS IR SOLN
Status: DC | PRN
  Administered 2012-10-10: 3000 mL

## 2012-10-10 MED ORDER — ONDANSETRON HCL 4 MG PO TABS: 4.0000 mg | ORAL_TABLET | Freq: Four times a day (QID) | ORAL | Status: DC | PRN

## 2012-10-10 MED ORDER — MIDAZOLAM HCL 2 MG/2ML IJ SOLN
INTRAMUSCULAR | Status: DC | PRN
  Administered 2012-10-10: 2 mg via INTRAVENOUS

## 2012-10-10 MED ORDER — MIDAZOLAM HCL 2 MG/2ML IJ SOLN
INTRAMUSCULAR | Status: AC
  Filled 2012-10-10: qty 2

## 2012-10-10 MED ORDER — DEXAMETHASONE SODIUM PHOSPHATE 4 MG/ML IJ SOLN
INTRAMUSCULAR | Status: DC | PRN
  Administered 2012-10-10: 10 mg via INTRAVENOUS

## 2012-10-10 MED ORDER — VASOPRESSIN 20 UNIT/ML IJ SOLN
INTRAMUSCULAR | Status: AC
  Filled 2012-10-10: qty 1

## 2012-10-10 MED ORDER — FENTANYL CITRATE 0.05 MG/ML IJ SOLN
INTRAMUSCULAR | Status: DC | PRN
  Administered 2012-10-10 (×2): 50 ug via INTRAVENOUS
  Administered 2012-10-10: 100 ug via INTRAVENOUS

## 2012-10-10 MED ORDER — HYDROMORPHONE HCL PF 1 MG/ML IJ SOLN
0.2500 mg | INTRAMUSCULAR | Status: DC | PRN
  Administered 2012-10-10 (×3): 0.5 mg via INTRAVENOUS

## 2012-10-10 MED ORDER — ONDANSETRON HCL 4 MG/2ML IJ SOLN
INTRAMUSCULAR | Status: AC
  Filled 2012-10-10: qty 2

## 2012-10-10 MED ORDER — LIDOCAINE HCL (CARDIAC) 20 MG/ML IV SOLN
INTRAVENOUS | Status: AC
  Filled 2012-10-10: qty 5

## 2012-10-10 MED ORDER — NEOSTIGMINE METHYLSULFATE 1 MG/ML IJ SOLN
INTRAMUSCULAR | Status: AC
  Filled 2012-10-10: qty 1

## 2012-10-10 MED ORDER — PROPOFOL 10 MG/ML IV EMUL
INTRAVENOUS | Status: AC
  Filled 2012-10-10: qty 20

## 2012-10-10 MED ORDER — PHENYLEPHRINE 40 MCG/ML (10ML) SYRINGE FOR IV PUSH (FOR BLOOD PRESSURE SUPPORT)
PREFILLED_SYRINGE | INTRAVENOUS | Status: AC
  Filled 2012-10-10: qty 5

## 2012-10-10 MED ORDER — METHYLENE BLUE 1 % INJ SOLN
INTRAMUSCULAR | Status: DC | PRN
  Administered 2012-10-10: 1 mL

## 2012-10-10 MED ORDER — ONDANSETRON HCL 4 MG/2ML IJ SOLN: 4.0000 mg | Freq: Four times a day (QID) | INTRAMUSCULAR | Status: DC | PRN

## 2012-10-10 MED ORDER — PHENYLEPHRINE HCL 10 MG/ML IJ SOLN
INTRAMUSCULAR | Status: DC | PRN
  Administered 2012-10-10 (×3): 80 ug via INTRAVENOUS

## 2012-10-10 MED ORDER — GLYCOPYRROLATE 0.2 MG/ML IJ SOLN
INTRAMUSCULAR | Status: AC
  Filled 2012-10-10: qty 2

## 2012-10-10 MED ORDER — MENTHOL 3 MG MT LOZG: 1.0000 | LOZENGE | OROMUCOSAL | Status: DC | PRN

## 2012-10-10 MED ORDER — NEOSTIGMINE METHYLSULFATE 1 MG/ML IJ SOLN
INTRAMUSCULAR | Status: DC | PRN
  Administered 2012-10-10: 2 mg via INTRAVENOUS

## 2012-10-10 MED ORDER — TEMAZEPAM 15 MG PO CAPS: 15.0000 mg | ORAL_CAPSULE | Freq: Every evening | ORAL | Status: DC | PRN

## 2012-10-10 MED ORDER — ROPIVACAINE HCL 5 MG/ML IJ SOLN
INTRAMUSCULAR | Status: DC | PRN
  Administered 2012-10-10: 60 mL

## 2012-10-10 MED ORDER — LACTATED RINGERS IV SOLN
INTRAVENOUS | Status: DC | PRN
  Administered 2012-10-10: 08:00:00 via INTRAVENOUS

## 2012-10-10 MED ORDER — KETOROLAC TROMETHAMINE 30 MG/ML IJ SOLN
INTRAMUSCULAR | Status: DC | PRN
  Administered 2012-10-10: 30 mg via INTRAVENOUS

## 2012-10-10 MED ORDER — GLYCOPYRROLATE 0.2 MG/ML IJ SOLN
INTRAMUSCULAR | Status: DC | PRN
  Administered 2012-10-10: 0.4 mg via INTRAVENOUS

## 2012-10-10 MED ORDER — HYDROMORPHONE HCL PF 1 MG/ML IJ SOLN
INTRAMUSCULAR | Status: AC
  Administered 2012-10-10: 0.5 mg via INTRAVENOUS
  Filled 2012-10-10: qty 1

## 2012-10-10 MED ORDER — OXYCODONE-ACETAMINOPHEN 5-325 MG PO TABS
1.0000 | ORAL_TABLET | ORAL | Status: DC | PRN
  Administered 2012-10-10: 2 via ORAL
  Filled 2012-10-10: qty 2

## 2012-10-10 MED ORDER — SODIUM CHLORIDE 0.9 % IJ SOLN
INTRAMUSCULAR | Status: AC
  Filled 2012-10-10: qty 10

## 2012-10-10 MED ORDER — INDIGOTINDISULFONATE SODIUM 8 MG/ML IJ SOLN
INTRAMUSCULAR | Status: AC
  Filled 2012-10-10: qty 5

## 2012-10-10 MED ORDER — KETOROLAC TROMETHAMINE 30 MG/ML IJ SOLN: 30.0000 mg | Freq: Four times a day (QID) | INTRAMUSCULAR | Status: DC

## 2012-10-10 MED ORDER — ROPIVACAINE HCL 5 MG/ML IJ SOLN
INTRAMUSCULAR | Status: AC
  Filled 2012-10-10: qty 60

## 2012-10-10 SURGICAL SUPPLY — 68 items
BARRIER ADHS 3X4 INTERCEED (GAUZE/BANDAGES/DRESSINGS) ×3 IMPLANT
BENZOIN TINCTURE PRP APPL 2/3 (GAUZE/BANDAGES/DRESSINGS) IMPLANT
BLADE LAP MORCELLATOR 15X9.5 (ELECTROSURGICAL) ×3 IMPLANT
CANNULA SEAL DVNC (CANNULA) ×6 IMPLANT
CANNULA SEALS DA VINCI (CANNULA) ×3
CATH FOLEY 3WAY  5CC 18FR (CATHETERS) ×1
CATH FOLEY 3WAY 5CC 18FR (CATHETERS) ×2 IMPLANT
CHLORAPREP W/TINT 26ML (MISCELLANEOUS) ×3 IMPLANT
CLOTH BEACON ORANGE TIMEOUT ST (SAFETY) ×3 IMPLANT
CONT PATH 16OZ SNAP LID 3702 (MISCELLANEOUS) ×3 IMPLANT
COVER MAYO STAND STRL (DRAPES) ×3 IMPLANT
COVER TABLE BACK 60X90 (DRAPES) ×6 IMPLANT
COVER TIP SHEARS 8 DVNC (MISCELLANEOUS) ×2 IMPLANT
COVER TIP SHEARS 8MM DA VINCI (MISCELLANEOUS) ×1
DECANTER SPIKE VIAL GLASS SM (MISCELLANEOUS) ×6 IMPLANT
DERMABOND ADVANCED (GAUZE/BANDAGES/DRESSINGS) ×1
DERMABOND ADVANCED .7 DNX12 (GAUZE/BANDAGES/DRESSINGS) ×2 IMPLANT
DRAPE HUG U DISPOSABLE (DRAPE) ×3 IMPLANT
DRAPE LG THREE QUARTER DISP (DRAPES) ×6 IMPLANT
DRAPE PROXIMA HALF (DRAPES) ×3 IMPLANT
DRAPE WARM FLUID 44X44 (DRAPE) ×3 IMPLANT
ELECT REM PT RETURN 9FT ADLT (ELECTROSURGICAL) ×3
ELECTRODE REM PT RTRN 9FT ADLT (ELECTROSURGICAL) ×2 IMPLANT
FILTER SMOKE EVAC LAPAROSHD (FILTER) ×3 IMPLANT
GAUZE VASELINE 3X9 (GAUZE/BANDAGES/DRESSINGS) IMPLANT
GLOVE BIOGEL PI IND STRL 7.0 (GLOVE) ×4 IMPLANT
GLOVE BIOGEL PI INDICATOR 7.0 (GLOVE) ×2
GLOVE ECLIPSE 6.5 STRL STRAW (GLOVE) ×9 IMPLANT
GOWN STRL REIN XL XLG (GOWN DISPOSABLE) ×18 IMPLANT
KIT ABG SYR 3ML LUER SLIP (SYRINGE) IMPLANT
KIT ACCESSORY DA VINCI DISP (KITS) ×1
KIT ACCESSORY DVNC DISP (KITS) ×2 IMPLANT
LEGGING LITHOTOMY PAIR STRL (DRAPES) ×3 IMPLANT
MANIPULATOR UTERINE 4.5 ZUMI (MISCELLANEOUS) IMPLANT
NEEDLE HYPO 22GX1.5 SAFETY (NEEDLE) ×6 IMPLANT
NEEDLE INSUFFLATION 120MM (ENDOMECHANICALS) ×3 IMPLANT
NEEDLE SPNL 22GX7 QUINCKE BK (NEEDLE) ×3 IMPLANT
NS IRRIG 1000ML POUR BTL (IV SOLUTION) ×9 IMPLANT
PACK LAVH (CUSTOM PROCEDURE TRAY) ×3 IMPLANT
SET CYSTO W/LG BORE CLAMP LF (SET/KITS/TRAYS/PACK) IMPLANT
SET IRRIG TUBING LAPAROSCOPIC (IRRIGATION / IRRIGATOR) ×3 IMPLANT
SOLUTION ELECTROLUBE (MISCELLANEOUS) ×3 IMPLANT
STRIP CLOSURE SKIN 1/2X4 (GAUZE/BANDAGES/DRESSINGS) IMPLANT
SUT MNCRL AB 3-0 PS2 27 (SUTURE) ×6 IMPLANT
SUT VIC AB 0 CT1 27 (SUTURE)
SUT VIC AB 0 CT1 27XBRD ANTBC (SUTURE) IMPLANT
SUT VIC AB 0 CT2 27 (SUTURE) IMPLANT
SUT VIC AB 2-0 CT2 27 (SUTURE) ×3 IMPLANT
SUT VICRYL 0 UR6 27IN ABS (SUTURE) ×6 IMPLANT
SUT VLOC 180 0 9IN  GS21 (SUTURE) ×3
SUT VLOC 180 0 9IN GS21 (SUTURE) ×6 IMPLANT
SUT VLOC 180 2-0 6IN GS21 (SUTURE) ×3 IMPLANT
SYR 50ML LL SCALE MARK (SYRINGE) ×3 IMPLANT
SYRINGE 10CC LL (SYRINGE) IMPLANT
SYSTEM CONVERTIBLE TROCAR (TROCAR) ×3 IMPLANT
TIP UTERINE 5.1X6CM LAV DISP (MISCELLANEOUS) IMPLANT
TIP UTERINE 6.7X10CM GRN DISP (MISCELLANEOUS) ×3 IMPLANT
TIP UTERINE 6.7X6CM WHT DISP (MISCELLANEOUS) IMPLANT
TIP UTERINE 6.7X8CM BLUE DISP (MISCELLANEOUS) IMPLANT
TOWEL OR 17X24 6PK STRL BLUE (TOWEL DISPOSABLE) ×9 IMPLANT
TRAY FOLEY CATH 16FR SILVER (SET/KITS/TRAYS/PACK) IMPLANT
TROCAR 12M 150ML BLUNT (TROCAR) ×3 IMPLANT
TROCAR DISP BLADELESS 8 DVNC (TROCAR) ×2 IMPLANT
TROCAR DISP BLADELESS 8MM (TROCAR) ×1
TROCAR OPTI TIP 12M 100M (ENDOMECHANICALS) IMPLANT
TROCAR XCEL 12X100 BLDLESS (ENDOMECHANICALS) ×3 IMPLANT
TROCAR XCEL NON-BLD 5MMX100MML (ENDOMECHANICALS) ×3 IMPLANT
TUBING FILTER THERMOFLATOR (ELECTROSURGICAL) ×3 IMPLANT

## 2012-10-10 NOTE — Anesthesia Preprocedure Evaluation (Addendum)

## 2012-10-10 NOTE — Anesthesia Postprocedure Evaluation (Signed)
  Anesthesia Post-op Note  Patient: Eileen Patel  Procedure(s) Performed: Procedure(s): ROBOTIC ASSISTED MYOMECTOMY WITH CHRMOPER TUBATION (N/A)  Patient Location: PACU  Anesthesia Type:General  Level of Consciousness: awake, alert  and oriented  Airway and Oxygen Therapy: Patient Spontanous Breathing  Post-op Pain: none  Post-op Assessment: Post-op Vital signs reviewed, Patient's Cardiovascular Status Stable, Respiratory Function Stable, Patent Airway, No signs of Nausea or vomiting and Pain level controlled  Post-op Vital Signs: Reviewed and stable  Complications: No apparent anesthesia complications

## 2012-10-10 NOTE — Interval H&P Note (Signed)
History and Physical Interval Note:  10/10/2012 7:33 AM  Eileen Patel  has presented today for surgery, with the diagnosis of Symptomatic Fibroids with Bilateral Tubal Blockage;   The various methods of treatment have been discussed with the patient and family. After consideration of risks, benefits and other options for treatment, the patient has consented to  Procedure(s): ROBOTIC ASSISTED MYOMECTOMY (N/A) LAPAROSCOPIC UNILATERAL SALPINGECTOMY (Left) as a surgical intervention .  The patient's history has been reviewed, patient examined, no change in status, stable for surgery.  I have reviewed the patient's chart and labs.  Questions were answered to the patient's satisfaction.     Fardowsa Authier A

## 2012-10-10 NOTE — Op Note (Addendum)
Preoperative diagnosis: Symptomatic uterine fibroids  Postoperative diagnosis: Same  Anesthesia: Gen.  Anesthesiologist: Dr. Mal Amabile  Procedure: Robotically-assisted myomectomy with chromopertubation  Surgeon: Dr. Dois Davenport Kaytlynne Neace  Assistant: Henreitta Leber P.A.-C.  Estimated blood loss: 200 cc  Procedure:  After being informed of the planned procedure with possible complications including but not limited to bleeding, infection, injury to other organs, need for laparotomy, likelihood of not removing all fibroid to do to the robotically-assisted approach, expected hospitals they in recovery, informed consent is obtained and patient is taken to or #7. She is placed in the lithotomy position on a sticky mattress and beanbag with both arms padded and tucked on each side and knee-high sequential compressive devices. She is given general anesthesia with endotracheal intubation without any complication. She's prepped and draped in a sterile fashion and a Foley catheter is inserted in her bladder. Pelvic exam reveals an 18 weeks size uterus that is mobile.  A weighted speculum is inserted in the vagina and the anterior lip of the cervix is grasped with a tenaculum forcep. The uterus is sounded at 10 cm and the cervix was easily dilated using Hegar dilator until #25 which allows easy placement of intrauterine RUMI manipulator. Retractors are removed.  We infiltrate 10 cm above the umbilicus using 4 cc of ropivacaine 0.25% and perform a vertical incision. It is brought down bluntly to the fascia which is identified and grasped with Coker forceps. The fascia is infiltrated with 6 cc of ropivacaine 0.25% and incised with Mayo scissors. Peritoneum is entered bluntly. A pursestring suture of 0 Vicryl is placed on the fascia and a 10 mm Hassan trocar is easily inserted in the abdominal cavity and held in place with a Purstring suture. This allows for easy insufflation of a pneumoperitoneum using warm to  CO2 at a maximum pressure of 15 mm of mercury. 60 cc of ropivacaine is then deposited in the pelvis perform a call. Trocar placement is decided and each incision is infiltrated with 10 cc of ropivacaine 0.25% per protocol. We position under direct visitation 2 8mm robotic trocar on the left, 1 8mm robotic trocar on the right and a 5 mm patient side assistant trocar on the right. Robot is docked on the left. A monopolar scissors is inserted in arm #1, a PK gyrus forcep is inserted in arm #2 and a tenaculum is inserted in arm #3. Preparation and docking is completed in 35 minutes.  Observation: The uterus shows multiple fibroids with 2 dominant anterior 1 fundal 1 mid body measuring 6 and 7 cm. On the posterior wall of the uterus we note a 2 cm left fibroid, 1 cm left fibroid, 1 cm posterior mid body fibroid, 1.5 cm posterior fibroid. We can also feel a 3.5-4 cm posterior intramural fibroid which appears to be blocking the left tube. Furthermore we note a 0.5 cm intramural fibroid posteriorly in the right lower aspect and a left posterior cornual fibroid measuring 1.5 cm. The right tube is missing its fimbriated status post partial salpingectomy do to ectopic pregnancy. The left tube has a normal appearance and normal fimbriae. Chromopertubation does not feel any of the tubes.  We then proceed with systematic removal of all 9 previously mentioned fibroid starting with the 2 anterior fibroid leaving a incision measuring 8 cm. Before removal serosa is infiltrated with vasopressin and opened with a open monopolar scissors. Dissection is performed using traction countertraction and sharp dissection. The 2 large fibroids were deposited in the posterior cul-de-sac and  we proceed with closure of the myometrial defect using 0V LOC. The defect is closed in 3 layers. The serosa is then closed with a baseball suture of 20V LOC. Hemostasis is adequate. We then addressed all of the posterior fibroids after infiltrating the  serosa with vasopressin and again opening with open monopolar scissors. All fibroids were deposited in the posterior cul-de-sac. We are left with 2 posterior defects measuring each 3.5 cm. They are both closed in 2 layers using 0V LOC followed by a 20V LOC baseball stitch. Hemostasis is deemed adequate. We irrigated profusely and suctioned. Console time is completed in 2 hours and 20 minutes.  The robot is undocked instruments are removed. The supraumbilical trocar is removed and replaced by the morcellator. The camera is modified for a 5 mm to fit our 8 mm ports. We proceed with systematic morcellation of all specimen over. Of 55 minutes.  We irrigated profusely and complete hemostasis on serosa using bipolar cauterization. Being satisfied with hemostasis we then cover our incisions with 2 half sheet of Interceed.  All instruments and trochars are removed under direct visualization after in vacuolated and the pneumoperitoneum. The fascia of the supraumbilical incision is closed with the previously placed pursestring suture of 0 Vicryl. Skin is then closed with subcuticular suture of 3-0 Monocryl and Dermabond.  Tenaculum forcep as well as RUMI intrauterine manipulator are removed. Inspection of the vagina and cervix reveals no active bleeding.  Instrument and sponge count is complete x2. Estimated blood loss is 200 cc. The procedure is well tolerated by the patient is taken to recovery room in a well and stable condition.  Specimen: Morcellated fibroids weighing 131 g sent to pathology  Do to previous C-section and one large antero-myometrial defect, recommendation will be for future cesarean births

## 2012-10-10 NOTE — Progress Notes (Signed)
Pt is discharged in the care of Mother. Downstairs per wheelchair. Lapsites are clean  And dry. Denies any heavy vaginal bleeding. Discharge instructions were given to pt. With Rx. Questions asked and answered. Stable.

## 2012-10-10 NOTE — Transfer of Care (Signed)
Immediate Anesthesia Transfer of Care Note  Patient: Eileen Patel  Procedure(s) Performed: Procedure(s): ROBOTIC ASSISTED MYOMECTOMY WITH CHRMOPER TUBATION (N/A)  Patient Location: PACU  Anesthesia Type:General  Level of Consciousness: awake  Airway & Oxygen Therapy: Patient Spontanous Breathing  Post-op Assessment: Report given to PACU RN  Post vital signs: stable  Filed Vitals:   10/10/12 0611  BP: 120/78  Pulse: 78  Temp: 36.9 C  Resp: 16    Complications: No apparent anesthesia complications

## 2012-10-10 NOTE — H&P (View-Only) (Signed)
Eileen Patel is a 32 y.o. female  P 1-0-5-1, presents for myomectomy because of symptomatic uterine fibroids, menorrhagia and dysmenorrhea. Since age 78 the patient has had heavy periods characterized by a 7 day flow with heavy flow pad change 6 times a day.  Her menstrual cramping requires narcotic analgesia and will only reduce her cramping to 9/10 on a 10 point pain scale from 10/10.  She denies any inter-menstrual bleeding, dyspareunia, changes in bowel or bladder habits.  By choice, she has not received any medical management for her symptoms in the past. A TSH and CBC in June 2014 were normal except that her hemoglobin was 11.1.    A pelvic ultrasound at that same time showed: uterus-8.38 x 8.44 x 8.41 cm with endometrium-8.51 mm; #4 intramural fibroids:  right anterior-5.92 x 4.01 x 4.29 cm;   right anterior- 3.85 x 4.86 x 3.69 cm;  posterior (displacing endometrium anteriorly) 2.80 x 2.92 x 2.48 cm and    posterior- 2.36 x 1.99 x 2.38 cm;   left ovary-3.20 x 2.96 x 2.13 cm and right ovary- 3.23 x 3.13 x 1.43 cm.  A review of both medical and surgical management options were given to the patient regarding  fibroids and symptom treatment,  however, she wants to proceed with a robot assisted myomectomy.  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 ferrous sulfate 325 (65 FE) MG tablet Take 325 mg by mouth daily with  breakfast.      . folic acid (FOLVITE) 400 MCG tablet Take 400 mcg by mouth daily.      Marland Kitchen oxycodone (OXY-IR) 5 MG capsule Take 5 mg by mouth every 4 (four) hours as needed for pain.       No facility-administered encounter medications on file as of 09/24/2012.    No Known Allergies  Denies sensitivity to peanuts, shellfish, soy, latex or adhesives.   ROS: Denies headache, vision changes, nasal congestion, dysphagia, tinnitus, dizziness, hoarseness, cough,  chest pain, shortness of breath, nausea, vomiting, diarrhea,constipation,  urinary frequency, urgency  dysuria, hematuria, vaginitis symptoms, swelling of joints,easy bruising,  myalgias, arthralgias, skin rashes, unexplained weight loss and except as is mentioned in the history of present illness, patient's review of systems is otherwise negative.   Physical Exam  Bp  118/66   P 100    R  14    Temperature  99.0 degrees F orally   Weight  199 lbs   Height  5'4"  Neck: supple without masses or thyromegaly Lungs: clear to auscultation Heart: regular rate and rhythm Abdomen: soft, non-tender and no organomegaly Pelvic:EGBUS- wnl; vagina-normal rugae; uterus-10-12 weeks size, irregular, cervix without lesions or motion tenderness; adnexae-no tenderness or masses Extremities:  no clubbing, cyanosis or edema   Assesment:  Symptomatic Uterine Fibroids  Menorrhagia                        Dysmenorrhea   Disposition:  Robotically-assisted myomectomy was reviewed with the patient.  Benefits of the robotic approach include lesser postoperative pain, less blood loss during surgery, reduced risk of injury to other organs due to better visualization (with a 3-D HD) 10 times magnifying camera, shorter hospital stay between 0-1 night and rapid recovery with return to daily routine in 2-3 weeks. Although robotically-assisted myomectomy has a longer operative time than traditional laparotomy, in a patient with good medical  history, the benefits usually outweigh the risks.  Risks include but are not limited to bleeding, infection, injury to other organs, need for laparotomy and transient post-operative facial edema.   Patient informed about FDA warning on use of morcellator dated 04/18/2012.  Discussed:  1. Incidence of post-operative diagnosis of sarcoma in women undergoing a hysterectomy is 2:1000 2. Annual incidence of leiomyosarcoma is 0.64/100,000 women 3. Sarcomas have the highest incidence in women over 65 4. Power morcellation involves risks of spreading tissue / disease. In he case of undiagnosed cancer, it may adversely affect the patient's prognosis.  Also discussed:  1.Lack of tactile feedback with the robotic approach leaving non-visible fibroids impossible to resect with potential future growth.  2. Need for cesarean section if there is uterine cavity entry and/or multiple incisions  3. Risk of developing NEW fibroids estimated at 25%  Further discussion was held with patient regarding the indication for her procedure(s) along with the risks, which include but are not limited to: reaction to anesthesia, and possible need for C-section if the endometrial cavity is breached.  Patient also understands that she is expected to return to regular activities in 2-3 weeks (with the exception of intercourse which will be 6 weeks).  The patient was given the Miralax bowel prep to be completed 24 hours prior to her surgery.  The patient verbalized understanding of these risks and pre-operative instructions and has consented to proceed with a Robot -Assisted Myomectomy with Possible Right Salpingectomy at [facility] [place] on October 08, 2012.    CSN# [id number]   Myleen Brailsford J. Lowell Guitar, PA-C  for Dr. Crist Fat. Rivard

## 2012-10-11 ENCOUNTER — Encounter (HOSPITAL_COMMUNITY): Payer: Self-pay | Admitting: Obstetrics and Gynecology

## 2012-10-11 NOTE — Discharge Summary (Signed)
Physician Discharge Summary  Patient ID: Eileen Patel MRN: [id number] DOB/AGE: 31/29/1982 31 y.o.  Admit date: 10/10/2012 Discharge date: 10/11/2012  Admission Diagnoses: Symptomatic Fibroids with Bilateral Tubal Blockage; CPT 58545 and  40981;  4.5 hours  Discharge Diagnoses: Symptomatic Fibroids with Bilateral Tubal Blockage; CPT 58545 and  19147;  4.5 hours          Discharged Condition: good  Hospital Course: Patient underwent Robotically-assisted myomectomy. 9 fibroids were removed weighing 131 g. Post-operatively, the patient progressed very well and was discharged 5 hours after surgery having met discharge criteria.  Consults: None    Disposition: 01-Home or Self Care     Medication List    STOP taking these medications       oxycodone 5 MG capsule  Commonly known as:  OXY-IR      TAKE these medications       ferrous sulfate 325 (65 FE) MG tablet  Take 325 mg by mouth daily with breakfast.     folic acid 400 MCG tablet  Commonly known as:  FOLVITE  Take 400 mcg by mouth daily.     ibuprofen 600 MG tablet  Commonly known as:  ADVIL,MOTRIN  Take 1 tablet (600 mg total) by mouth every 6 (six) hours as needed for pain.     oxyCODONE-acetaminophen 5-325 MG per tablet  Commonly known as:  PERCOCET  Take 1 tablet by mouth every 4 (four) hours as needed for pain.         Signed: Esmeralda Arthur, MD 10/11/2012, 5:44 PM

## 2012-10-17 ENCOUNTER — Encounter: Payer: Self-pay | Admitting: Obstetrics and Gynecology

## 2013-10-01 ENCOUNTER — Encounter (HOSPITAL_COMMUNITY): Payer: Self-pay | Admitting: *Deleted

## 2013-10-01 ENCOUNTER — Inpatient Hospital Stay (HOSPITAL_COMMUNITY)
Admission: AD | Admit: 2013-10-01 | Discharge: 2013-10-01 | Disposition: A | Discharge status: Home / Self Care | Payer: BC Managed Care – PPO | Source: Ambulatory Visit | Attending: Obstetrics & Gynecology | Admitting: Obstetrics & Gynecology

## 2013-10-01 DIAGNOSIS — R509 Fever, unspecified: Secondary | ICD-10-CM | POA: Insufficient documentation

## 2013-10-01 DIAGNOSIS — N760 Acute vaginitis: Secondary | ICD-10-CM | POA: Diagnosis not present

## 2013-10-01 DIAGNOSIS — A499 Bacterial infection, unspecified: Secondary | ICD-10-CM | POA: Insufficient documentation

## 2013-10-01 DIAGNOSIS — N390 Urinary tract infection, site not specified: Secondary | ICD-10-CM | POA: Diagnosis not present

## 2013-10-01 DIAGNOSIS — B9689 Other specified bacterial agents as the cause of diseases classified elsewhere: Secondary | ICD-10-CM | POA: Diagnosis not present

## 2013-10-01 DIAGNOSIS — R112 Nausea with vomiting, unspecified: Secondary | ICD-10-CM | POA: Insufficient documentation

## 2013-10-01 LAB — CBC WITH DIFFERENTIAL/PLATELET
BASOS ABS: 0 10*3/uL (ref 0.0–0.1)
Basophils Relative: 0 % (ref 0–1)
EOS ABS: 0 10*3/uL (ref 0.0–0.7)
EOS PCT: 0 % (ref 0–5)
HEMATOCRIT: 33.9 % — AB (ref 36.0–46.0)
Hemoglobin: 11.1 g/dL — ABNORMAL LOW (ref 12.0–15.0)
LYMPHS PCT: 11 % — AB (ref 12–46)
Lymphs Abs: 1.3 10*3/uL (ref 0.7–4.0)
MCH: 25.6 pg — AB (ref 26.0–34.0)
MCHC: 32.7 g/dL (ref 30.0–36.0)
MCV: 78.3 fL (ref 78.0–100.0)
MONO ABS: 1 10*3/uL (ref 0.1–1.0)
Monocytes Relative: 8 % (ref 3–12)
Neutro Abs: 9.9 10*3/uL — ABNORMAL HIGH (ref 1.7–7.7)
Neutrophils Relative %: 81 % — ABNORMAL HIGH (ref 43–77)
Platelets: 154 10*3/uL (ref 150–400)
RBC: 4.33 MIL/uL (ref 3.87–5.11)
RDW: 15.9 % — AB (ref 11.5–15.5)
WBC: 12.2 10*3/uL — ABNORMAL HIGH (ref 4.0–10.5)

## 2013-10-01 LAB — COMPREHENSIVE METABOLIC PANEL
ALT: 10 U/L (ref 0–35)
AST: 10 U/L (ref 0–37)
Albumin: 3.6 g/dL (ref 3.5–5.2)
Alkaline Phosphatase: 60 U/L (ref 39–117)
Anion gap: 16 — ABNORMAL HIGH (ref 5–15)
BUN: 9 mg/dL (ref 6–23)
CALCIUM: 9.7 mg/dL (ref 8.4–10.5)
CO2: 20 meq/L (ref 19–32)
CREATININE: 0.91 mg/dL (ref 0.50–1.10)
Chloride: 102 mEq/L (ref 96–112)
GFR calc non Af Amer: 83 mL/min — ABNORMAL LOW (ref >90)
Glucose, Bld: 101 mg/dL — ABNORMAL HIGH (ref 70–99)
Potassium: 3.6 mEq/L — ABNORMAL LOW (ref 3.7–5.3)
Sodium: 138 mEq/L (ref 137–147)
Total Bilirubin: 0.8 mg/dL (ref 0.3–1.2)
Total Protein: 7.8 g/dL (ref 6.0–8.3)

## 2013-10-01 LAB — URINE MICROSCOPIC-ADD ON

## 2013-10-01 LAB — LIPASE, BLOOD: Lipase: 17 U/L (ref 11–59)

## 2013-10-01 LAB — WET PREP, GENITAL
Trich, Wet Prep: NONE SEEN
YEAST WET PREP: NONE SEEN

## 2013-10-01 LAB — URINALYSIS, ROUTINE W REFLEX MICROSCOPIC
Bilirubin Urine: NEGATIVE
Glucose, UA: NEGATIVE mg/dL
KETONES UR: 15 mg/dL — AB
Nitrite: POSITIVE — AB
PROTEIN: 30 mg/dL — AB
Specific Gravity, Urine: 1.015 (ref 1.005–1.030)
Urobilinogen, UA: 1 mg/dL (ref 0.0–1.0)
pH: 8.5 — ABNORMAL HIGH (ref 5.0–8.0)

## 2013-10-01 LAB — POCT PREGNANCY, URINE: Preg Test, Ur: NEGATIVE

## 2013-10-01 LAB — AMYLASE: Amylase: 46 U/L (ref 0–105)

## 2013-10-01 MED ORDER — SULFAMETHOXAZOLE-TRIMETHOPRIM 800-160 MG PO TABS: 1.0000 | ORAL_TABLET | Freq: Two times a day (BID) | ORAL | Status: DC

## 2013-10-01 MED ORDER — IBUPROFEN 600 MG PO TABS: 600.0000 mg | ORAL_TABLET | Freq: Four times a day (QID) | ORAL | Status: DC | PRN

## 2013-10-01 MED ORDER — ONDANSETRON 8 MG PO TBDP
8.0000 mg | ORAL_TABLET | Freq: Once | ORAL | Status: AC
  Administered 2013-10-01: 8 mg via ORAL
  Filled 2013-10-01: qty 1

## 2013-10-01 MED ORDER — LACTATED RINGERS IV SOLN
Freq: Once | INTRAVENOUS | Status: AC
  Administered 2013-10-01: 19:00:00 via INTRAVENOUS

## 2013-10-01 MED ORDER — KETOROLAC TROMETHAMINE 30 MG/ML IJ SOLN
30.0000 mg | Freq: Once | INTRAMUSCULAR | Status: AC
  Administered 2013-10-01: 30 mg via INTRAVENOUS
  Filled 2013-10-01: qty 1

## 2013-10-01 MED ORDER — OSELTAMIVIR PHOSPHATE 75 MG PO CAPS: 75.0000 mg | ORAL_CAPSULE | Freq: Two times a day (BID) | ORAL | Status: DC

## 2013-10-01 MED ORDER — LACTATED RINGERS IV SOLN
INTRAVENOUS | Status: DC
  Filled 2013-10-01 (×12): qty 1000

## 2013-10-01 MED ORDER — ONDANSETRON HCL 4 MG PO TABS: 4.0000 mg | ORAL_TABLET | Freq: Three times a day (TID) | ORAL | Status: DC | PRN

## 2013-10-01 MED ORDER — METRONIDAZOLE 500 MG PO TABS: 500.0000 mg | ORAL_TABLET | Freq: Two times a day (BID) | ORAL | Status: DC

## 2013-10-01 NOTE — MAU Provider Note (Signed)
Eileen Patel is a 33 y.o. [phone], non pregnant presents to MAU c/o n/v, bodyache, blood in her urine and pain with urination all x 1 week.  Pt reports a hx of fibriod surgery 10/14 and think "they are returning bc it hurts the same"   History     Patient Active Problem List   Diagnosis Date Noted  . Back pain 08/06/2010    Chief Complaint  Patient presents with  . Fever   HPI  OB History   Grav Para Term Preterm Abortions TAB SAB Ect Mult Living   6 1 1  0 5 0 5 0 0 1      Past Medical History  Diagnosis Date  . Vaginal fibroids     pt reports hx of ABD pain with  fibroids  . Anemia     Past Surgical History  Procedure Laterality Date  . Cesarean section  2008  . Diagnostic laparoscopy      ectopic preg  . Wisdom tooth extraction    . Robot assisted myomectomy N/A 10/10/2012    Procedure: ROBOTIC ASSISTED MYOMECTOMY WITH CHRMOPER TUBATION;  Surgeon: Alwyn Pea, MD;  Location: [facility];  Service: Gynecology;  Laterality: N/A;    History reviewed. No pertinent family history.  History  Substance Use Topics  . Smoking status: Never Smoker   . Smokeless tobacco: Never Used  . Alcohol Use: No    Allergies: No Known Allergies  Prescriptions prior to admission  Medication Sig Dispense Refill  . ferrous sulfate 325 (65 FE) MG tablet Take 325 mg by mouth daily with breakfast.      . folic acid (FOLVITE) 562 MCG tablet Take 400 mcg by mouth daily.      Marland Kitchen ibuprofen (ADVIL,MOTRIN) 600 MG tablet Take 1 tablet (600 mg total) by mouth every 6 (six) hours as needed for pain.  30 tablet  0  . oxyCODONE-acetaminophen (PERCOCET) 5-325 MG per tablet Take 1 tablet by mouth every 4 (four) hours as needed for pain.  30 tablet  0    Review of Systems  Constitutional: Positive for fever and chills.  Gastrointestinal: Positive for nausea, vomiting and abdominal pain. Negative for constipation.  Genitourinary: Positive for dysuria, hematuria and flank pain.   Musculoskeletal: Positive for myalgias.  Skin: Negative.   Neurological: Positive for weakness.  Endo/Heme/Allergies: Negative.    See HPI above, all other systems are negative  Physical Exam   Blood pressure 132/68, pulse 119, temperature 102.8 F (39.3 C), temperature source Oral, resp. rate 22, height 5\' 4"  (1.626 m), weight 86.268 kg (190 lb 3 oz), last menstrual period 09/02/2013, SpO2 100.00%.   Physical Exam  Constitutional: She is oriented to person, place, and time. She appears well-developed.  GI: Soft. There is tenderness. There is rebound and guarding.  Genitourinary: Vagina normal.  Musculoskeletal: Normal range of motion.  Neurological: She is alert and oriented to person, place, and time. She has normal reflexes.  Skin: Skin is dry.  Ext:  WNL ABD: Soft, tender to palpation, rebound or guarding in the RLQ SVE:  Wnl, no blood observed   ED Course  Assessment: UTI    Plan: IV bolus CBC, CMET, amylase, lypase GC/CT Flu UA UPT Wet prep Zofran Toradol    Julyanna Scholle, CNM, MSN 10/01/2013. 6:56 PM

## 2013-10-01 NOTE — MAU Provider Note (Signed)
MAU Addendum Note  Results for orders placed during the hospital encounter of 10/01/13 (from the past 24 hour(s))  URINALYSIS, ROUTINE W REFLEX MICROSCOPIC     Status: Abnormal   Collection Time    10/01/13  5:55 PM      Result Value Ref Range   Color, Urine YELLOW  YELLOW   APPearance HAZY (*) CLEAR   Specific Gravity, Urine 1.015  1.005 - 1.030   pH 8.5 (*) 5.0 - 8.0   Glucose, UA NEGATIVE  NEGATIVE mg/dL   Hgb urine dipstick SMALL (*) NEGATIVE   Bilirubin Urine NEGATIVE  NEGATIVE   Ketones, ur 15 (*) NEGATIVE mg/dL   Protein, ur 30 (*) NEGATIVE mg/dL   Urobilinogen, UA 1.0  0.0 - 1.0 mg/dL   Nitrite POSITIVE (*) NEGATIVE   Leukocytes, UA LARGE (*) NEGATIVE  URINE MICROSCOPIC-ADD ON     Status: Abnormal   Collection Time    10/01/13  5:55 PM      Result Value Ref Range   Squamous Epithelial / LPF FEW (*) RARE   WBC, UA TOO NUMEROUS TO COUNT  <3 WBC/hpf   RBC / HPF 7-10  <3 RBC/hpf   Bacteria, UA MANY (*) RARE  POCT PREGNANCY, URINE     Status: None   Collection Time    10/01/13  6:16 PM      Result Value Ref Range   Preg Test, Ur NEGATIVE  NEGATIVE  CBC WITH DIFFERENTIAL     Status: Abnormal   Collection Time    10/01/13  6:36 PM      Result Value Ref Range   WBC 12.2 (*) 4.0 - 10.5 K/uL   RBC 4.33  3.87 - 5.11 MIL/uL   Hemoglobin 11.1 (*) 12.0 - 15.0 g/dL   HCT 33.9 (*) 36.0 - 46.0 %   MCV 78.3  78.0 - 100.0 fL   MCH 25.6 (*) 26.0 - 34.0 pg   MCHC 32.7  30.0 - 36.0 g/dL   RDW 15.9 (*) 11.5 - 15.5 %   Platelets 154  150 - 400 K/uL   Neutrophils Relative % 81 (*) 43 - 77 %   Neutro Abs 9.9 (*) 1.7 - 7.7 K/uL   Lymphocytes Relative 11 (*) 12 - 46 %   Lymphs Abs 1.3  0.7 - 4.0 K/uL   Monocytes Relative 8  3 - 12 %   Monocytes Absolute 1.0  0.1 - 1.0 K/uL   Eosinophils Relative 0  0 - 5 %   Eosinophils Absolute 0.0  0.0 - 0.7 K/uL   Basophils Relative 0  0 - 1 %   Basophils Absolute 0.0  0.0 - 0.1 K/uL  COMPREHENSIVE METABOLIC PANEL     Status: Abnormal   Collection Time    10/01/13  6:36 PM      Result Value Ref Range   Sodium 138  137 - 147 mEq/L   Potassium 3.6 (*) 3.7 - 5.3 mEq/L   Chloride 102  96 - 112 mEq/L   CO2 20  19 - 32 mEq/L   Glucose, Bld 101 (*) 70 - 99 mg/dL   BUN 9  6 - 23 mg/dL   Creatinine, Ser 0.91  0.50 - 1.10 mg/dL   Calcium 9.7  8.4 - 10.5 mg/dL   Total Protein 7.8  6.0 - 8.3 g/dL   Albumin 3.6  3.5 - 5.2 g/dL   AST 10  0 - 37 U/L   ALT 10  0 - 35  U/L   Alkaline Phosphatase 60  39 - 117 U/L   Total Bilirubin 0.8  0.3 - 1.2 mg/dL   GFR calc non Af Amer 83 (*) >90 mL/min   GFR calc Af Amer >90  >90 mL/min   Anion gap 16 (*) 5 - 15  WET PREP, GENITAL     Status: Abnormal   Collection Time    10/01/13  7:25 PM      Result Value Ref Range   Yeast Wet Prep HPF POC NONE SEEN  NONE SEEN   Trich, Wet Prep NONE SEEN  NONE SEEN   Clue Cells Wet Prep HPF POC FEW (*) NONE SEEN   WBC, Wet Prep HPF POC MODERATE (*) NONE SEEN  AMYLASE     Status: None   Collection Time    10/01/13  8:15 PM      Result Value Ref Range   Amylase 46  0 - 105 U/L  LIPASE, BLOOD     Status: None   Collection Time    10/01/13  8:15 PM      Result Value Ref Range   Lipase 17  11 - 59 U/L   A/P +BV +UTI Influenza results pending   Rx Bactrum for UTI Rx Flagyl for BV Rx Zofran for N/V Rx Tamaflu DC to home  FU in the office 1 week if S/S are not better   Arelyn Gauer, CNM, MSN 10/01/2013. 10:02 PM

## 2013-10-01 NOTE — MAU Note (Signed)
Pt states here for fever and chills, temp in triage 102.8. Has had nausea and vomiting since last week, denies diarrhea. Had surgery last October to remove fibroids. When lying down feels something shift and feels a tight pulling and has to hold lower abdomen. Pain/burning and blood clots with voiding x1 week.

## 2013-10-01 NOTE — Discharge Instructions (Signed)
Influenza Influenza ("the flu") is a viral infection of the respiratory tract. It occurs more often in winter months because people spend more time in close contact with one another. Influenza can make you feel very sick. Influenza easily spreads from person to person (contagious). CAUSES  Influenza is caused by a virus that infects the respiratory tract. You can catch the virus by breathing in droplets from an infected person's cough or sneeze. You can also catch the virus by touching something that was recently contaminated with the virus and then touching your mouth, nose, or eyes. RISKS AND COMPLICATIONS You may be at risk for a more severe case of influenza if you smoke cigarettes, have diabetes, have chronic heart disease (such as heart failure) or lung disease (such as asthma), or if you have a weakened immune system. Elderly people and pregnant women are also at risk for more serious infections. The most common problem of influenza is a lung infection (pneumonia). Sometimes, this problem can require emergency medical care and may be life threatening. SIGNS AND SYMPTOMS  Symptoms typically last 4 to 10 days and may include:  Fever.  Chills.  Headache, body aches, and muscle aches.  Sore throat.  Chest discomfort and cough.  Poor appetite.  Weakness or feeling tired.  Dizziness.  Nausea or vomiting. DIAGNOSIS  Diagnosis of influenza is often made based on your history and a physical exam. A nose or throat swab test can be done to confirm the diagnosis. TREATMENT  In mild cases, influenza goes away on its own. Treatment is directed at relieving symptoms. For more severe cases, your health care provider may prescribe antiviral medicines to shorten the sickness. Antibiotic medicines are not effective because the infection is caused by a virus, not by bacteria. HOME CARE INSTRUCTIONS  Take medicines only as directed by your health care provider.  Use a cool mist humidifier to make  breathing easier.  Get plenty of rest until your temperature returns to normal. This usually takes 3 to 4 days.  Drink enough fluid to keep your urine clear or pale yellow.  Cover yourmouth and nosewhen coughing or sneezing,and wash your handswellto prevent thevirusfrom spreading.  Stay homefromwork orschool untilthe fever is gonefor at least 26full day. PREVENTION  An annual influenza vaccination (flu shot) is the best way to avoid getting influenza. An annual flu shot is now routinely recommended for all adults in the [place] IF:  You experiencechest pain, yourcough worsens,or you producemore mucus.  Youhave nausea,vomiting, ordiarrhea.  Your fever returns or gets worse. SEEK IMMEDIATE MEDICAL CARE IF:  You havetrouble breathing, you become short of breath,or your skin ornails becomebluish.  You have severe painor stiffnessin the neck.  You develop a sudden headache, or pain in the face or ear.  You have nausea or vomiting that you cannot control. MAKE SURE YOU:   Understand these instructions.  Will watch your condition.  Will get help right away if you are not doing well or get worse. Document Released: 12/17/1999 Document Revised: 05/05/2013 Document Reviewed: 03/20/2011 [facility] Patient Information 2015 [place], [place]. This information is not intended to replace advice given to you by your health care provider. Make sure you discuss any questions you have with your health care provider. Urinary Tract Infection Urinary tract infections (UTIs) can develop anywhere along your urinary tract. Your urinary tract is your body's drainage system for removing wastes and extra water. Your urinary tract includes two kidneys, two ureters, a bladder, and a urethra.  Your kidneys are a pair of bean-shaped organs. Each kidney is about the size of your fist. They are located below your ribs, one on each side of your spine. CAUSES Infections are  caused by microbes, which are microscopic organisms, including fungi, viruses, and bacteria. These organisms are so small that they can only be seen through a microscope. Bacteria are the microbes that most commonly cause UTIs. SYMPTOMS  Symptoms of UTIs may vary by age and gender of the patient and by the location of the infection. Symptoms in young women typically include a frequent and intense urge to urinate and a painful, burning feeling in the bladder or urethra during urination. Older women and men are more likely to be tired, shaky, and weak and have muscle aches and abdominal pain. A fever may mean the infection is in your kidneys. Other symptoms of a kidney infection include pain in your back or sides below the ribs, nausea, and vomiting. DIAGNOSIS To diagnose a UTI, your caregiver will ask you about your symptoms. Your caregiver also will ask to provide a urine sample. The urine sample will be tested for bacteria and white blood cells. White blood cells are made by your body to help fight infection. TREATMENT  Typically, UTIs can be treated with medication. Because most UTIs are caused by a bacterial infection, they usually can be treated with the use of antibiotics. The choice of antibiotic and length of treatment depend on your symptoms and the type of bacteria causing your infection. HOME CARE INSTRUCTIONS  If you were prescribed antibiotics, take them exactly as your caregiver instructs you. Finish the medication even if you feel better after you have only taken some of the medication.  Drink enough water and fluids to keep your urine clear or pale yellow.  Avoid caffeine, tea, and carbonated beverages. They tend to irritate your bladder.  Empty your bladder often. Avoid holding urine for long periods of time.  Empty your bladder before and after sexual intercourse.  After a bowel movement, women should cleanse from front to back. Use each tissue only once. SEEK MEDICAL CARE IF:     You have back pain.  You develop a fever.  Your symptoms do not begin to resolve within 3 days. SEEK IMMEDIATE MEDICAL CARE IF:   You have severe back pain or lower abdominal pain.  You develop chills.  You have nausea or vomiting.  You have continued burning or discomfort with urination. MAKE SURE YOU:   Understand these instructions.  Will watch your condition.  Will get help right away if you are not doing well or get worse. Document Released: 09/28/2004 Document Revised: 06/20/2011 Document Reviewed: 01/27/2011 [facility] Patient Information 2015 [place], [place]. This information is not intended to replace advice given to you by your health care provider. Make sure you discuss any questions you have with your health care provider. Bacterial Vaginosis Bacterial vaginosis is a vaginal infection that occurs when the normal balance of bacteria in the vagina is disrupted. It results from an overgrowth of certain bacteria. This is the most common vaginal infection in women of childbearing age. Treatment is important to prevent complications, especially in pregnant women, as it can cause a premature delivery. CAUSES  Bacterial vaginosis is caused by an increase in harmful bacteria that are normally present in smaller amounts in the vagina. Several different kinds of bacteria can cause bacterial vaginosis. However, the reason that the condition develops is not fully understood. RISK FACTORS Certain activities or behaviors  can put you at an increased risk of developing bacterial vaginosis, including:  Having a new sex partner or multiple sex partners.  Douching.  Using an intrauterine device (IUD) for contraception. Women do not get bacterial vaginosis from toilet seats, bedding, swimming pools, or contact with objects around them. SIGNS AND SYMPTOMS  Some women with bacterial vaginosis have no signs or symptoms. Common symptoms include:  Grey vaginal discharge.  A fishlike odor  with discharge, especially after sexual intercourse.  Itching or burning of the vagina and vulva.  Burning or pain with urination. DIAGNOSIS  Your health care provider will take a medical history and examine the vagina for signs of bacterial vaginosis. A sample of vaginal fluid may be taken. Your health care provider will look at this sample under a microscope to check for bacteria and abnormal cells. A vaginal pH test may also be done.  TREATMENT  Bacterial vaginosis may be treated with antibiotic medicines. These may be given in the form of a pill or a vaginal cream. A second round of antibiotics may be prescribed if the condition comes back after treatment.  HOME CARE INSTRUCTIONS   Only take over-the-counter or prescription medicines as directed by your health care provider.  If antibiotic medicine was prescribed, take it as directed. Make sure you finish it even if you start to feel better.  Do not have sex until treatment is completed.  Tell all sexual partners that you have a vaginal infection. They should see their health care provider and be treated if they have problems, such as a mild rash or itching.  Practice safe sex by using condoms and only having one sex partner. SEEK MEDICAL CARE IF:   Your symptoms are not improving after 3 days of treatment.  You have increased discharge or pain.  You have a fever. MAKE SURE YOU:   Understand these instructions.  Will watch your condition.  Will get help right away if you are not doing well or get worse. FOR MORE INFORMATION  Centers for Disease Control and Prevention, Division of STD Prevention: [employer] American Sexual Health Association (ASHA): www.ashastd.org  Document Released: 12/19/2004 Document Revised: 10/09/2012 Document Reviewed: 07/31/2012 [facility] Patient Information 2015 [place], [place]. This information is not intended to replace advice given to you by your health care provider. Make sure you discuss any  questions you have with your health care provider.

## 2013-10-01 NOTE — Progress Notes (Signed)
CNM informed of pt status, informed of orders given by NP, agrees to go ahead with all orders, is coming to see pt.

## 2013-10-02 LAB — INFLUENZA PANEL BY PCR (TYPE A & B)
H1N1 flu by pcr: NOT DETECTED
INFLAPCR: NEGATIVE
INFLBPCR: NEGATIVE

## 2013-10-02 LAB — GC/CHLAMYDIA PROBE AMP
CT PROBE, AMP APTIMA: NEGATIVE
GC PROBE AMP APTIMA: NEGATIVE

## 2013-10-02 LAB — HIV ANTIBODY (ROUTINE TESTING W REFLEX): HIV 1&2 Ab, 4th Generation: NONREACTIVE

## 2013-10-03 ENCOUNTER — Encounter (HOSPITAL_COMMUNITY): Payer: Self-pay | Admitting: Emergency Medicine

## 2013-10-03 DIAGNOSIS — R51 Headache: Secondary | ICD-10-CM | POA: Diagnosis present

## 2013-10-03 DIAGNOSIS — Z8742 Personal history of other diseases of the female genital tract: Secondary | ICD-10-CM | POA: Insufficient documentation

## 2013-10-03 DIAGNOSIS — D649 Anemia, unspecified: Secondary | ICD-10-CM | POA: Diagnosis not present

## 2013-10-03 DIAGNOSIS — D8684 Sarcoid pyelonephritis: Secondary | ICD-10-CM | POA: Diagnosis not present

## 2013-10-03 DIAGNOSIS — Z79899 Other long term (current) drug therapy: Secondary | ICD-10-CM | POA: Diagnosis not present

## 2013-10-03 DIAGNOSIS — Z792 Long term (current) use of antibiotics: Secondary | ICD-10-CM | POA: Diagnosis not present

## 2013-10-03 LAB — COMPREHENSIVE METABOLIC PANEL
ALBUMIN: 3.3 g/dL — AB (ref 3.5–5.2)
ALT: 38 U/L — ABNORMAL HIGH (ref 0–35)
AST: 35 U/L (ref 0–37)
Alkaline Phosphatase: 69 U/L (ref 39–117)
Anion gap: 15 (ref 5–15)
BUN: 9 mg/dL (ref 6–23)
CALCIUM: 9 mg/dL (ref 8.4–10.5)
CO2: 19 mEq/L (ref 19–32)
CREATININE: 0.93 mg/dL (ref 0.50–1.10)
Chloride: 104 mEq/L (ref 96–112)
GFR calc Af Amer: >90 mL/min (ref >90)
GFR, EST NON AFRICAN AMERICAN: 80 mL/min — AB (ref >90)
Glucose, Bld: 95 mg/dL (ref 70–99)
Potassium: 3.9 mEq/L (ref 3.7–5.3)
Sodium: 138 mEq/L (ref 137–147)
Total Bilirubin: 0.3 mg/dL (ref 0.3–1.2)
Total Protein: 8.4 g/dL — ABNORMAL HIGH (ref 6.0–8.3)

## 2013-10-03 LAB — CBC WITH DIFFERENTIAL/PLATELET
BASOS ABS: 0 10*3/uL (ref 0.0–0.1)
Basophils Relative: 0 % (ref 0–1)
EOS PCT: 0 % (ref 0–5)
Eosinophils Absolute: 0 10*3/uL (ref 0.0–0.7)
HCT: 31.4 % — ABNORMAL LOW (ref 36.0–46.0)
Hemoglobin: 10.3 g/dL — ABNORMAL LOW (ref 12.0–15.0)
Lymphocytes Relative: 28 % (ref 12–46)
Lymphs Abs: 1.5 10*3/uL (ref 0.7–4.0)
MCH: 25.1 pg — AB (ref 26.0–34.0)
MCHC: 32.8 g/dL (ref 30.0–36.0)
MCV: 76.4 fL — AB (ref 78.0–100.0)
MONO ABS: 0.4 10*3/uL (ref 0.1–1.0)
Monocytes Relative: 8 % (ref 3–12)
Neutro Abs: 3.3 10*3/uL (ref 1.7–7.7)
Neutrophils Relative %: 64 % (ref 43–77)
Platelets: 157 10*3/uL (ref 150–400)
RBC: 4.11 MIL/uL (ref 3.87–5.11)
RDW: 15.7 % — AB (ref 11.5–15.5)
WBC: 5.2 10*3/uL (ref 4.0–10.5)

## 2013-10-03 MED ORDER — ACETAMINOPHEN 325 MG PO TABS
650.0000 mg | ORAL_TABLET | Freq: Four times a day (QID) | ORAL | Status: DC | PRN
  Administered 2013-10-04: 650 mg via ORAL
  Filled 2013-10-03: qty 2

## 2013-10-03 NOTE — ED Notes (Signed)
Pt reports generalized HA, body aches  And N/V x 1 week. States she was seen at [facility] for same, dx with UTI and is currently taking antibiotic. Reports chills, but unsure if she has fever. Pt denies CP or SOB. Denies abdominal pain.

## 2013-10-04 ENCOUNTER — Emergency Department (HOSPITAL_COMMUNITY): Payer: BC Managed Care – PPO

## 2013-10-04 ENCOUNTER — Emergency Department (HOSPITAL_COMMUNITY)
Admission: EM | Admit: 2013-10-04 | Discharge: 2013-10-04 | Disposition: A | Discharge status: Home / Self Care | Payer: BC Managed Care – PPO | Attending: Emergency Medicine | Admitting: Emergency Medicine

## 2013-10-04 DIAGNOSIS — N12 Tubulo-interstitial nephritis, not specified as acute or chronic: Secondary | ICD-10-CM

## 2013-10-04 LAB — URINALYSIS, ROUTINE W REFLEX MICROSCOPIC
Bilirubin Urine: NEGATIVE
Glucose, UA: NEGATIVE mg/dL
Hgb urine dipstick: NEGATIVE
Ketones, ur: 40 mg/dL — AB
NITRITE: NEGATIVE
PH: 7.5 (ref 5.0–8.0)
PROTEIN: NEGATIVE mg/dL
Specific Gravity, Urine: 1.018 (ref 1.005–1.030)
Urobilinogen, UA: 1 mg/dL (ref 0.0–1.0)

## 2013-10-04 LAB — URINE MICROSCOPIC-ADD ON

## 2013-10-04 LAB — I-STAT CG4 LACTIC ACID, ED: Lactic Acid, Venous: 0.77 mmol/L (ref 0.5–2.2)

## 2013-10-04 MED ORDER — CEPHALEXIN 500 MG PO CAPS: 500.0000 mg | ORAL_CAPSULE | Freq: Four times a day (QID) | ORAL | Status: DC

## 2013-10-04 MED ORDER — SODIUM CHLORIDE 0.9 % IV BOLUS (SEPSIS)
1000.0000 mL | Freq: Once | INTRAVENOUS | Status: AC
  Administered 2013-10-04: 1000 mL via INTRAVENOUS

## 2013-10-04 MED ORDER — DEXTROSE 5 % IV SOLN
1.0000 g | Freq: Once | INTRAVENOUS | Status: AC
  Administered 2013-10-04: 1 g via INTRAVENOUS
  Filled 2013-10-04: qty 10

## 2013-10-04 MED ORDER — ONDANSETRON HCL 4 MG/2ML IJ SOLN
4.0000 mg | Freq: Once | INTRAMUSCULAR | Status: AC
  Administered 2013-10-04: 4 mg via INTRAVENOUS
  Filled 2013-10-04: qty 2

## 2013-10-04 MED ORDER — PHENAZOPYRIDINE HCL 200 MG PO TABS: 200.0000 mg | ORAL_TABLET | Freq: Three times a day (TID) | ORAL | Status: DC

## 2013-10-04 MED ORDER — ONDANSETRON 8 MG PO TBDP: 8.0000 mg | ORAL_TABLET | Freq: Three times a day (TID) | ORAL | Status: DC | PRN

## 2013-10-04 NOTE — ED Notes (Signed)
Pt A&Ox4, ambulatory at d/c with steady gait, NAD 

## 2013-10-04 NOTE — Discharge Instructions (Signed)
We saw you in the ER for the fevers, weakness, back pain, chills. All the results in the ER are normal, except for urine. We suspect that you have a kidney infection.  Please return to the ER if your symptoms worsen; you have increased pain, fevers, chills, inability to keep any medications down, confusion. Otherwise see the outpatient doctor as requested.   Pyelonephritis, Adult Pyelonephritis is a kidney infection. In general, there are 2 main types of pyelonephritis:  Infections that come on quickly without any warning (acute pyelonephritis).  Infections that persist for a long period of time (chronic pyelonephritis). CAUSES  Two main causes of pyelonephritis are:  Bacteria traveling from the bladder to the kidney. This is a problem especially in pregnant women. The urine in the bladder can become filled with bacteria from multiple causes, including:  Inflammation of the prostate gland (prostatitis).  Sexual intercourse in females.  Bladder infection (cystitis).  Bacteria traveling from the bloodstream to the tissue part of the kidney. Problems that may increase your risk of getting a kidney infection include:  Diabetes.  Kidney stones or bladder stones.  Cancer.  Catheters placed in the bladder.  Other abnormalities of the kidney or ureter. SYMPTOMS   Abdominal pain.  Pain in the side or flank area.  Fever.  Chills.  Upset stomach.  Blood in the urine (dark urine).  Frequent urination.  Strong or persistent urge to urinate.  Burning or stinging when urinating. DIAGNOSIS  Your caregiver may diagnose your kidney infection based on your symptoms. A urine sample may also be taken. TREATMENT  In general, treatment depends on how severe the infection is.   If the infection is mild and caught early, your caregiver may treat you with oral antibiotics and send you home.  If the infection is more severe, the bacteria may have gotten into the bloodstream. This  will require intravenous (IV) antibiotics and a hospital stay. Symptoms may include:  High fever.  Severe flank pain.  Shaking chills.  Even after a hospital stay, your caregiver may require you to be on oral antibiotics for a period of time.  Other treatments may be required depending upon the cause of the infection. HOME CARE INSTRUCTIONS   Take your antibiotics as directed. Finish them even if you start to feel better.  Make an appointment to have your urine checked to make sure the infection is gone.  Drink enough fluids to keep your urine clear or pale yellow.  Take medicines for the bladder if you have urgency and frequency of urination as directed by your caregiver. SEEK IMMEDIATE MEDICAL CARE IF:   You have a fever or persistent symptoms for more than 2-3 days.  You have a fever and your symptoms suddenly get worse.  You are unable to take your antibiotics or fluids.  You develop shaking chills.  You experience extreme weakness or fainting.  There is no improvement after 2 days of treatment. MAKE SURE YOU:  Understand these instructions.  Will watch your condition.  Will get help right away if you are not doing well or get worse. Document Released: 12/19/2004 Document Revised: 06/20/2011 Document Reviewed: 05/25/2010 [facility] Patient Information 2015 [place], [place]. This information is not intended to replace advice given to you by your health care provider. Make sure you discuss any questions you have with your health care provider.

## 2013-10-04 NOTE — ED Provider Notes (Signed)
CSN: [id number]     Arrival date & time 10/03/13  2234 History   First MD Initiated Contact with Patient 10/04/13 0100     Chief Complaint  Patient presents with  . Headache  . Generalized Body Aches     (Consider location/radiation/quality/duration/timing/severity/associated sxs/prior Treatment) HPI Comments: Pt comes in with cc of headaches, bodyaches, nausea, emesis, back pain. Pt started feeling unwell over the weekend, and went to [facility], as she was also having pelvic pain and has hx of fibroids. She had UTI like sx, and was noted to have a uti and BV - discharged with bactrim and keflex. Pt however, continues to have fevers, and is having back pain. Pt has no nausea, emesis, chills. She has myalgias and headaches. No neck pain. No travel hx, sick exposures, tick bites. Pt feels well during the day, and is going to work, but her sx get severe in the evening.  Patient is a 33 y.o. female presenting with headaches. The history is provided by the patient.  Headache Associated symptoms: abdominal pain, back pain, fatigue, fever, myalgias, nausea and vomiting   Associated symptoms: no congestion, no cough, no drainage, no neck pain and no neck stiffness     Past Medical History  Diagnosis Date  . Vaginal fibroids     pt reports hx of ABD pain with  fibroids  . Anemia    Past Surgical History  Procedure Laterality Date  . Cesarean section  2008  . Diagnostic laparoscopy      ectopic preg  . Wisdom tooth extraction    . Robot assisted myomectomy N/A 10/10/2012    Procedure: ROBOTIC ASSISTED MYOMECTOMY WITH CHRMOPER TUBATION;  Surgeon: Alwyn Pea, MD;  Location: [facility];  Service: Gynecology;  Laterality: N/A;   No family history on file. History  Substance Use Topics  . Smoking status: Never Smoker   . Smokeless tobacco: Never Used  . Alcohol Use: No   OB History   Grav Para Term Preterm Abortions TAB SAB Ect Mult Living   6 1 1  0 5 0 5 0 0 1     Review of  Systems  Constitutional: Positive for fever, chills, activity change and fatigue.  HENT: Negative for congestion, postnasal drip and rhinorrhea.   Respiratory: Negative for cough and shortness of breath.   Cardiovascular: Negative for chest pain.  Gastrointestinal: Positive for nausea, vomiting and abdominal pain.  Genitourinary: Positive for flank pain. Negative for dysuria.  Musculoskeletal: Positive for arthralgias, back pain and myalgias. Negative for neck pain and neck stiffness.  Skin: Negative for rash.  Neurological: Positive for headaches.      Allergies  Review of patient's allergies indicates no known allergies.  Home Medications   Prior to Admission medications   Medication Sig Start Date End Date Taking? Authorizing Provider  ferrous sulfate 325 (65 FE) MG tablet Take 325 mg by mouth daily with breakfast.   Yes Historical Provider, MD  ibuprofen (ADVIL,MOTRIN) 600 MG tablet Take 600 mg by mouth every 6 (six) hours as needed for mild pain.   Yes Historical Provider, MD  metroNIDAZOLE (FLAGYL) 500 MG tablet Take 500 mg by mouth 2 (two) times daily.   Yes Historical Provider, MD  cephALEXin (KEFLEX) 500 MG capsule Take 1 capsule (500 mg total) by mouth 4 (four) times daily. 10/04/13   Varney Biles, MD  ondansetron (ZOFRAN ODT) 8 MG disintegrating tablet Take 1 tablet (8 mg total) by mouth every 8 (eight) hours as needed  for nausea. 10/04/13   Varney Biles, MD  phenazopyridine (PYRIDIUM) 200 MG tablet Take 1 tablet (200 mg total) by mouth 3 (three) times daily. 10/04/13   Medea Deines Kathrynn Humble, MD   BP 107/62  Pulse 85  Temp(Src) 99 F (37.2 C) (Oral)  Resp 16  Ht 5\' 4"  (1.626 m)  Wt 193 lb (87.544 kg)  BMI 33.11 kg/m2  SpO2 98%  LMP 09/02/2013 Physical Exam  Nursing note and vitals reviewed. Constitutional: She is oriented to person, place, and time. She appears well-developed and well-nourished.  HENT:  Head: Normocephalic and atraumatic.  Eyes: EOM are normal. Pupils  are equal, round, and reactive to light.  Neck: Neck supple.  Cardiovascular: Normal rate, regular rhythm and normal heart sounds.   No murmur heard. Pulmonary/Chest: Effort normal. No respiratory distress.  Abdominal: Soft. She exhibits no distension. There is no tenderness. There is no rebound and no guarding.  Musculoskeletal: She exhibits no edema.  Lumbar spine tenderness, diffuse. Rt flank tenderness  Neurological: She is alert and oriented to person, place, and time. No cranial nerve deficit.  Skin: Skin is warm and dry.    ED Course  Procedures (including critical care time) Labs Review Labs Reviewed  CBC WITH DIFFERENTIAL - Abnormal; Notable for the following:    Hemoglobin 10.3 (*)    HCT 31.4 (*)    MCV 76.4 (*)    MCH 25.1 (*)    RDW 15.7 (*)    All other components within normal limits  COMPREHENSIVE METABOLIC PANEL - Abnormal; Notable for the following:    Total Protein 8.4 (*)    Albumin 3.3 (*)    ALT 38 (*)    GFR calc non Af Amer 80 (*)    All other components within normal limits  URINALYSIS, ROUTINE W REFLEX MICROSCOPIC - Abnormal; Notable for the following:    Ketones, ur 40 (*)    Leukocytes, UA SMALL (*)    All other components within normal limits  URINE MICROSCOPIC-ADD ON - Abnormal; Notable for the following:    Squamous Epithelial / LPF FEW (*)    All other components within normal limits  URINE CULTURE  I-STAT CG4 LACTIC ACID, ED    Imaging Review Dg Lumbar Spine Complete  10/04/2013   CLINICAL DATA:  Chronic low back pain for 7 years. Pain radiates down the legs bilaterally. No known injury.  EXAM: LUMBAR SPINE - COMPLETE 4+ VIEW  COMPARISON:  None.  FINDINGS: There is no evidence of lumbar spine fracture. Alignment is normal. Intervertebral disc spaces are maintained.  IMPRESSION: Negative.   Electronically Signed   By: Lucienne Capers M.D.   On: 10/04/2013 03:37     EKG Interpretation None      MDM   Final diagnoses:   Pyelonephritis   Pt comes in with multiple complains - but primarily back pain, fevers, chills, n/v. She was dx with UTI, but is unable to keep her meds down.  I suspect that she has pyelonephritis, as she has flank pain with fevers, chills. UA has some WBCs. We gave her ceftriaxone and oral challenge was passed. Pt had a normal pelvic just few days back - no need for repeat.  PT has no abd tenderness on exam - and so i dont think CT is needed, as the flank pain is likely from pyelo. Pt also had lspine tenderness. No risk factors for epidural and no associated numbness, weakness, urinary incontinence, urinary retention, bowel incontinence - so Xray of  spine ordered, but MRI indication at this time.  Return precautions discussed, as i am not sure why she has myalgias and headaches. There could be superimposed viral infection. There is no meningeal signs.     Varney Biles, MD 10/04/13 [phone]

## 2013-10-05 LAB — URINE CULTURE
Colony Count: NO GROWTH
Culture: NO GROWTH
Special Requests: NORMAL

## 2013-10-30 ENCOUNTER — Other Ambulatory Visit: Payer: Self-pay | Admitting: Obstetrics and Gynecology

## 2013-10-30 DIAGNOSIS — N971 Female infertility of tubal origin: Secondary | ICD-10-CM

## 2013-11-03 ENCOUNTER — Encounter (HOSPITAL_COMMUNITY): Payer: Self-pay | Admitting: Emergency Medicine

## 2013-11-13 ENCOUNTER — Ambulatory Visit
Admission: RE | Admit: 2013-11-13 | Discharge: 2013-11-13 | Disposition: A | Discharge status: Home / Self Care | Payer: BC Managed Care – PPO | Source: Ambulatory Visit | Attending: Obstetrics and Gynecology | Admitting: Obstetrics and Gynecology

## 2013-11-13 ENCOUNTER — Encounter (INDEPENDENT_AMBULATORY_CARE_PROVIDER_SITE_OTHER): Payer: Self-pay

## 2013-11-13 DIAGNOSIS — N971 Female infertility of tubal origin: Secondary | ICD-10-CM

## 2013-11-13 NOTE — Consult Note (Signed)
Chief Complaint: Uterine fibroids, infertility, history of fallopian tube blockage.  Referring Physician(s): Rivard,Sandra  History of Present Illness: Eileen Patel is a 33 y.o. female who was referred for possible fallopian tube recannalization procedure. Patient is G6 P1 A5. She is status post roboticmyomectomy 10/10/2012 removing 9 fibroids. She has a history of prior right tubal ectopic pregnancy resulting in right salpingectomy in 2011. 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 Right tube not visualized related to prior surgical resection. Since the myomectomy, she has not been able to get pregnant. Her menstrual cycles have improved since the myomectomy. She has a four-day cycle with passage of blood clots. No interval fevers. No abnormal discharge. No current medications or birth control except for an occasional Vicodin.  Past Medical History  Diagnosis Date  . Vaginal fibroids     pt reports hx of ABD pain with  fibroids  . Anemia     Past Surgical History  Procedure Laterality Date  . Cesarean section  2008  . Diagnostic laparoscopy      ectopic preg  . Wisdom tooth extraction    . Robot assisted myomectomy N/A 10/10/2012    Procedure: ROBOTIC ASSISTED MYOMECTOMY WITH CHRMOPER TUBATION;  Surgeon: Alwyn Pea, MD;  Location: [facility];  Service: Gynecology;  Laterality: N/A;    Allergies: Review of patient's allergies indicates no known allergies.  Medications: Prior to Admission medications   Medication Sig Start Date End Date Taking? Authorizing Provider  HYDROcodone-acetaminophen (NORCO/VICODIN) 5-325 MG per tablet Take 1 tablet by mouth every 6 (six) hours as needed for moderate pain.   Yes Historical Provider, MD  cephALEXin (KEFLEX) 500 MG capsule Take 1 capsule (500 mg total) by mouth 4 (four) times daily. 10/04/13   Varney Biles, MD  ferrous sulfate 325 (65 FE) MG tablet Take 325  mg by mouth daily with breakfast.    Historical Provider, MD  ibuprofen (ADVIL,MOTRIN) 600 MG tablet Take 600 mg by mouth every 6 (six) hours as needed for mild pain.    Historical Provider, MD  metroNIDAZOLE (FLAGYL) 500 MG tablet Take 500 mg by mouth 2 (two) times daily.    Historical Provider, MD  ondansetron (ZOFRAN ODT) 8 MG disintegrating tablet Take 1 tablet (8 mg total) by mouth every 8 (eight) hours as needed for nausea. 10/04/13   Varney Biles, MD  phenazopyridine (PYRIDIUM) 200 MG tablet Take 1 tablet (200 mg total) by mouth 3 (three) times daily. 10/04/13   Varney Biles, MD    No family history on file.  History   Social History  . Marital Status: Single    Spouse Name: N/A    Number of Children: N/A  . Years of Education: N/A   Social History Main Topics  . Smoking status: Never Smoker   . Smokeless tobacco: Never Used  . Alcohol Use: No  . Drug Use: No  . Sexual Activity: Yes    Birth Control/ Protection: None   Other Topics Concern  . None   Social History Narrative     Review of Systems: A 12 point ROS discussed and pertinent positives are indicated in the HPI above.  All other systems are negative.  Review of Systems  Constitutional: Negative for activity change and appetite change.  Respiratory: Negative for cough, chest tightness and shortness of breath.   Cardiovascular: Negative for chest pain.  Gastrointestinal: Negative for abdominal distention.  Genitourinary: Negative for dysuria, hematuria and flank  pain.    Vital Signs: BP 117/67 mmHg  Pulse 77  Temp(Src) 98.7 F (37.1 C) (Oral)  Resp 14  Ht 5\' 4"  (1.626 m)  Wt 192 lb (87.091 kg)  BMI 32.94 kg/m2  SpO2 100%  LMP 11/04/2013  Physical Exam  Vitals reviewed. physical exam deferred today.  Imaging: Previous HSG from August 2014 was reviewed directly on the monitor with the patient. This demonstrates absence of a right fallopian tube and left fallopian tube distal occlusion.    Labs:  CBC:  Recent Labs  10/01/13 1836 10/03/13 2255  WBC 12.2* 5.2  HGB 11.1* 10.3*  HCT 33.9* 31.4*  PLT 154 157    BMP:  Recent Labs  10/01/13 1836 10/03/13 2255  NA 138 138  K 3.6* 3.9  CL 102 104  CO2 20 19  GLUCOSE 101* 95  BUN 9 9  CALCIUM 9.7 9.0  CREATININE 0.91 0.93  GFRNONAA 83* 80*  GFRAA >90 >90    LIVER FUNCTION TESTS:  Recent Labs  10/01/13 1836 10/03/13 2255  BILITOT 0.8 0.3  AST 10 35  ALT 10 38*  ALKPHOS 60 69  PROT 7.8 8.4*  ALBUMIN 3.6 3.3*    Assessment and Plan:  33 year old female with history of prior right ectopic requiring right salpingectomy. Also history of numerous uterine fibroids status post robotic myomectomy 10/10/2012. 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  Patient still has not been able to get pregnant despite the surgery. Repeat HSG with attempt at left fallopian tube recannulization was discussed at length with the patient. The procedure, risks, benefits and alternatives were reviewed. Patient understands the procedure and all questions were addressed. She would like to schedule procedure following her next cycle. She was given a prescription for doxycycline 100 mg by mouth twice a day to begin 2 days prior to the procedure. This will be scheduled in the next couple of months.  Thank you for this interesting consult.  I greatly enjoyed meeting Eileen Patel and look forward to participating in their care.    I spent a total of  30 minutes face to face in clinical consultation, greater than 50% of which was counseling/coordinating care for the patient.  SignedGreggory Keen T. 11/13/2013, 4:33 PM

## 2013-11-21 ENCOUNTER — Other Ambulatory Visit (HOSPITAL_COMMUNITY): Payer: Self-pay | Admitting: Interventional Radiology

## 2013-11-21 ENCOUNTER — Other Ambulatory Visit: Payer: Self-pay | Admitting: Interventional Radiology

## 2013-11-21 DIAGNOSIS — N971 Female infertility of tubal origin: Secondary | ICD-10-CM

## 2014-01-08 ENCOUNTER — Other Ambulatory Visit: Payer: Self-pay | Admitting: Radiology

## 2014-01-12 ENCOUNTER — Ambulatory Visit (HOSPITAL_COMMUNITY)
Admission: RE | Admit: 2014-01-12 | Discharge: 2014-01-12 | Disposition: A | Discharge status: Home / Self Care | Payer: Self-pay | Source: Ambulatory Visit | Attending: Interventional Radiology | Admitting: Interventional Radiology

## 2014-01-12 ENCOUNTER — Encounter (HOSPITAL_COMMUNITY): Payer: Self-pay

## 2014-01-12 DIAGNOSIS — Z538 Procedure and treatment not carried out for other reasons: Secondary | ICD-10-CM | POA: Insufficient documentation

## 2014-01-12 DIAGNOSIS — D259 Leiomyoma of uterus, unspecified: Secondary | ICD-10-CM | POA: Insufficient documentation

## 2014-01-12 DIAGNOSIS — N971 Female infertility of tubal origin: Secondary | ICD-10-CM | POA: Insufficient documentation

## 2014-01-12 DIAGNOSIS — Z01812 Encounter for preprocedural laboratory examination: Secondary | ICD-10-CM | POA: Insufficient documentation

## 2014-01-12 DIAGNOSIS — Z9079 Acquired absence of other genital organ(s): Secondary | ICD-10-CM | POA: Insufficient documentation

## 2014-01-12 LAB — BASIC METABOLIC PANEL
ANION GAP: 5 (ref 5–15)
BUN: 15 mg/dL (ref 6–23)
CO2: 23 mmol/L (ref 19–32)
Calcium: 9.1 mg/dL (ref 8.4–10.5)
Chloride: 107 mEq/L (ref 96–112)
Creatinine, Ser: 0.67 mg/dL (ref 0.50–1.10)
GFR calc non Af Amer: >90 mL/min (ref >90)
Glucose, Bld: 98 mg/dL (ref 70–99)
Potassium: 4 mmol/L (ref 3.5–5.1)
Sodium: 135 mmol/L (ref 135–145)

## 2014-01-12 LAB — CBC WITH DIFFERENTIAL/PLATELET
Basophils Absolute: 0.1 10*3/uL (ref 0.0–0.1)
Basophils Relative: 1 % (ref 0–1)
EOS PCT: 3 % (ref 0–5)
Eosinophils Absolute: 0.2 10*3/uL (ref 0.0–0.7)
HCT: 32.2 % — ABNORMAL LOW (ref 36.0–46.0)
Hemoglobin: 10.3 g/dL — ABNORMAL LOW (ref 12.0–15.0)
LYMPHS PCT: 53 % — AB (ref 12–46)
Lymphs Abs: 2.8 10*3/uL (ref 0.7–4.0)
MCH: 25.1 pg — ABNORMAL LOW (ref 26.0–34.0)
MCHC: 32 g/dL (ref 30.0–36.0)
MCV: 78.3 fL (ref 78.0–100.0)
MONO ABS: 0.4 10*3/uL (ref 0.1–1.0)
Monocytes Relative: 7 % (ref 3–12)
NEUTROS PCT: 36 % — AB (ref 43–77)
Neutro Abs: 1.9 10*3/uL (ref 1.7–7.7)
Platelets: 180 10*3/uL (ref 150–400)
RBC: 4.11 MIL/uL (ref 3.87–5.11)
RDW: 14.6 % (ref 11.5–15.5)
WBC: 5.3 10*3/uL (ref 4.0–10.5)

## 2014-01-12 LAB — PROTIME-INR
INR: 1 (ref 0.00–1.49)
PROTHROMBIN TIME: 13.3 s (ref 11.6–15.2)

## 2014-01-12 LAB — APTT: APTT: 26 s (ref 24–37)

## 2014-01-12 MED ORDER — FENTANYL CITRATE 0.05 MG/ML IJ SOLN
INTRAMUSCULAR | Status: AC
  Filled 2014-01-12: qty 4

## 2014-01-12 MED ORDER — SODIUM CHLORIDE 0.9 % IV SOLN
INTRAVENOUS | Status: DC
  Administered 2014-01-12: 08:00:00 via INTRAVENOUS

## 2014-01-12 MED ORDER — MIDAZOLAM HCL 2 MG/2ML IJ SOLN
INTRAMUSCULAR | Status: AC
  Filled 2014-01-12: qty 6

## 2014-01-12 NOTE — Progress Notes (Signed)
Unable to preform HSG fallopian tube recanalization due to lack of proper equipment. Pt rescheduled for 01/15/14.

## 2014-01-12 NOTE — Discharge Instructions (Addendum)
Conscious Sedation °Sedation is the use of medicines to promote relaxation and relieve discomfort and anxiety. Conscious sedation is a type of sedation. Under conscious sedation you are less alert than normal but are still able to respond to instructions or stimulation. Conscious sedation is used during short medical and dental procedures. It is milder than deep sedation or general anesthesia and allows you to return to your regular activities sooner.  °LET YOUR HEALTH CARE PROVIDER KNOW ABOUT:  °· Any allergies you have. °· All medicines you are taking, including vitamins, herbs, eye drops, creams, and over-the-counter medicines. °· Use of steroids (by mouth or creams). °· Previous problems you or members of your family have had with the use of anesthetics. °· Any blood disorders you have. °· Previous surgeries you have had. °· Medical conditions you have. °· Possibility of pregnancy, if this applies. °· Use of cigarettes, alcohol, or illegal drugs. °RISKS AND COMPLICATIONS °Generally, this is a safe procedure. However, as with any procedure, problems can occur. Possible problems include: °· Oversedation. °· Trouble breathing on your own. You may need to have a breathing tube until you are awake and breathing on your own. °· Allergic reaction to any of the medicines used for the procedure. °BEFORE THE PROCEDURE °· You may have blood tests done. These tests can help show how well your kidneys and liver are working. They can also show how well your blood clots. °· A physical exam will be done.   °· Only take medicines as directed by your health care provider. You may need to stop taking medicines (such as blood thinners, aspirin, or nonsteroidal anti-inflammatory drugs) before the procedure.   °· Do not eat or drink at least 6 hours before the procedure or as directed by your health care provider. °· Arrange for a responsible adult, family member, or friend to take you home after the procedure. He or she should stay  with you for at least 24 hours after the procedure, until the medicine has worn off. °PROCEDURE  °· An intravenous (IV) catheter will be inserted into one of your veins. Medicine will be able to flow directly into your body through this catheter. You may be given medicine through this tube to help prevent pain and help you relax. °· The medical or dental procedure will be done. °AFTER THE PROCEDURE °· You will stay in a recovery area until the medicine has worn off. Your blood pressure and pulse will be checked.   °·  Depending on the procedure you had, you may be allowed to go home when you can tolerate liquids and your pain is under control. °Document Released: 09/13/2000 Document Revised: 12/24/2012 Document Reviewed: 08/26/2012 °Exit[employer]® Patient Information ©2015 [employer]. This information is not intended to replace advice given to you by your health care provider. Make sure you discuss any questions you have with your health care provider. ° °

## 2014-01-12 NOTE — H&P (Signed)
Chief Complaint: Uterine fibroids, infertility, history of fallopian tube blockage  Referring Physician(s): Dr. Cletis Media History of Present Illness: Eileen Patel is a 34 y.o. female with history of prior right ectopic pregnancy requiring right salpingectomy. Also history of numerous uterine fibroids status post robotic myomectomy 10/10/2012. 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 Patient still has not been able to get pregnant despite the surgery. She presents today following recent IR consultation for repeat HSG with attempt at left fallopian tube recannulization.   Past Medical History  Diagnosis Date  . Vaginal fibroids     pt reports hx of ABD pain with  fibroids  . Anemia     Past Surgical History  Procedure Laterality Date  . Cesarean section  2008  . Diagnostic laparoscopy      ectopic preg  . Wisdom tooth extraction    . Robot assisted myomectomy N/A 10/10/2012    Procedure: ROBOTIC ASSISTED MYOMECTOMY WITH CHRMOPER TUBATION;  Surgeon: Alwyn Pea, MD;  Location: [facility];  Service: Gynecology;  Laterality: N/A;    Allergies: Review of patient's allergies indicates no known allergies.  Medications: Prior to Admission medications   Medication Sig Start Date End Date Taking? Authorizing Provider  cephALEXin (KEFLEX) 500 MG capsule Take 1 capsule (500 mg total) by mouth 4 (four) times daily. Patient not taking: Reported on 01/12/2014 10/04/13   Varney Biles, MD  HYDROcodone-acetaminophen (NORCO/VICODIN) 5-325 MG per tablet Take 1 tablet by mouth every 6 (six) hours as needed for moderate pain.    Historical Provider, MD  ibuprofen (ADVIL,MOTRIN) 600 MG tablet Take 600 mg by mouth every 6 (six) hours as needed for mild pain.    Historical Provider, MD  ondansetron (ZOFRAN ODT) 8 MG disintegrating tablet Take 1 tablet (8 mg total) by mouth every 8 (eight) hours as needed for nausea. Patient not  taking: Reported on 01/12/2014 10/04/13   Varney Biles, MD  phenazopyridine (PYRIDIUM) 200 MG tablet Take 1 tablet (200 mg total) by mouth 3 (three) times daily. Patient not taking: Reported on 01/12/2014 10/04/13   Varney Biles, MD    No family history on file.  History   Social History  . Marital Status: Single    Spouse Name: N/A    Number of Children: N/A  . Years of Education: N/A   Social History Main Topics  . Smoking status: Never Smoker   . Smokeless tobacco: Never Used  . Alcohol Use: No  . Drug Use: No  . Sexual Activity: Yes    Birth Control/ Protection: None   Other Topics Concern  . Not on file   Social History Narrative      Review of Systems  Constitutional: Negative for fever and chills.  Respiratory: Negative for cough and shortness of breath.   Cardiovascular: Negative for chest pain.  Gastrointestinal: Negative for nausea, vomiting, abdominal pain and blood in stool.  Genitourinary: Negative for dysuria and hematuria.  Musculoskeletal: Negative for back pain.  Neurological: Negative for headaches.  Hematological: Does not bruise/bleed easily.    Vital Signs: BP 146/88 mmHg  Pulse 70  Temp(Src) 98.3 F (36.8 C) (Oral)  Resp 18  Ht 5\' 4"  (1.626 m)  Wt 192 lb (87.091 kg)  BMI 32.94 kg/m2  SpO2 100%  LMP 01/01/2014  Physical Exam  Constitutional: She is oriented to person, place, and time. She appears well-developed and well-nourished.  Cardiovascular: Normal rate and regular rhythm.  Pulmonary/Chest: Effort normal and breath sounds normal.  Abdominal: Soft. Bowel sounds are normal. There is no tenderness.  Musculoskeletal: Normal range of motion. She exhibits no edema.  Neurological: She is alert and oriented to person, place, and time.    Imaging: No results found.  Labs:  CBC:  Recent Labs  10/01/13 1836 10/03/13 2255 01/12/14 0800  WBC 12.2* 5.2 5.3  HGB 11.1* 10.3* 10.3*  HCT 33.9* 31.4* 32.2*  PLT 154 157 180     COAGS:  Recent Labs  01/12/14 0800  INR 1.00  APTT 26    BMP:  Recent Labs  10/01/13 1836 10/03/13 2255  NA 138 138  K 3.6* 3.9  CL 102 104  CO2 20 19  GLUCOSE 101* 95  BUN 9 9  CALCIUM 9.7 9.0  CREATININE 0.91 0.93  GFRNONAA 83* 80*  GFRAA >90 >90    LIVER FUNCTION TESTS:  Recent Labs  10/01/13 1836 10/03/13 2255  BILITOT 0.8 0.3  AST 10 35  ALT 10 38*  ALKPHOS 60 69  PROT 7.8 8.4*  ALBUMIN 3.6 3.3*    TUMOR MARKERS: No results for input(s): AFPTM, CEA, CA199, CHROMGRNA in the last 8760 hours.  Assessment and Plan: Eileen Patel is a 34 y.o. female with history of prior right ectopic pregnancy requiring right salpingectomy. Also history of numerous uterine fibroids status post robotic myomectomy 10/10/2012. 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 Patient still has not been able to get pregnant despite the surgery. She presents today following recent IR consultation for repeat HSG with attempt at left fallopian tube recannulization. Details/risks of procedure d/w pt with her understanding and consent.       SignedAutumn Messing 01/12/2014, 8:47 AM

## 2014-01-12 NOTE — Progress Notes (Signed)
Patient ID: Eileen Patel, female   DOB: 09/20/1980, 34 y.o.   MRN: [id number] Pt's repeat HSG/fallopian tube recannulization procedure has been rescheduled for 1/14 due to unavailability of catheter needed for case. Pt was given prescription for oral doxycycline to be taken pre/post procedure. Plans d/w pt.

## 2014-01-13 ENCOUNTER — Other Ambulatory Visit: Payer: Self-pay | Admitting: Radiology

## 2014-01-15 ENCOUNTER — Other Ambulatory Visit (HOSPITAL_COMMUNITY): Payer: Self-pay | Admitting: Interventional Radiology

## 2014-01-15 ENCOUNTER — Ambulatory Visit (HOSPITAL_COMMUNITY)
Admission: RE | Admit: 2014-01-15 | Discharge: 2014-01-15 | Disposition: A | Discharge status: Home / Self Care | Payer: Self-pay | Source: Ambulatory Visit | Attending: Interventional Radiology | Admitting: Interventional Radiology

## 2014-01-15 ENCOUNTER — Encounter (HOSPITAL_COMMUNITY): Payer: Self-pay

## 2014-01-15 DIAGNOSIS — N971 Female infertility of tubal origin: Secondary | ICD-10-CM | POA: Insufficient documentation

## 2014-01-15 DIAGNOSIS — D281 Benign neoplasm of vagina: Secondary | ICD-10-CM | POA: Insufficient documentation

## 2014-01-15 MED ORDER — FENTANYL CITRATE 0.05 MG/ML IJ SOLN
INTRAMUSCULAR | Status: AC
  Filled 2014-01-15: qty 6

## 2014-01-15 MED ORDER — IOHEXOL 300 MG/ML  SOLN
10.0000 mL | Freq: Once | INTRAMUSCULAR | Status: AC | PRN
  Administered 2014-01-15: 10 mL

## 2014-01-15 MED ORDER — MIDAZOLAM HCL 2 MG/2ML IJ SOLN
INTRAMUSCULAR | Status: AC
  Filled 2014-01-15: qty 6

## 2014-01-15 MED ORDER — MIDAZOLAM HCL 2 MG/2ML IJ SOLN
INTRAMUSCULAR | Status: AC | PRN
  Administered 2014-01-15 (×4): 1 mg via INTRAVENOUS

## 2014-01-15 MED ORDER — FENTANYL CITRATE 0.05 MG/ML IJ SOLN
INTRAMUSCULAR | Status: AC | PRN
  Administered 2014-01-15 (×4): 50 ug via INTRAVENOUS

## 2014-01-15 MED ORDER — HYDROCODONE-ACETAMINOPHEN 5-325 MG PO TABS
2.0000 | ORAL_TABLET | ORAL | Status: DC | PRN
Start: 2014-01-15 — End: 2014-01-16
  Administered 2014-01-15: 2 via ORAL
  Filled 2014-01-15: qty 2

## 2014-01-15 MED ORDER — SODIUM CHLORIDE 0.9 % IV SOLN
INTRAVENOUS | Status: DC
  Administered 2014-01-15: 11:00:00 via INTRAVENOUS

## 2014-01-15 NOTE — Procedures (Signed)
Successful RIGHT fallopian tube cath/wire access for recanalization With right peritoneal spillage documented No comp Stable

## 2014-01-15 NOTE — Discharge Instructions (Signed)
Transcervical Fallopian Tube Catheterization , after care Refer to this sheet in the next few weeks. These instructions provide you with information on caring for yourself after your procedure. Your health care provider may also give you more specific instructions. Your treatment has been planned according to current medical practices, but problems sometimes occur. Call your health care provider if you have any problems or questions after your procedure. WHAT TO EXPECT AFTER THE PROCEDURE After your procedure, it is typical to have the following:  Mild cramps.  Mild bleeding or discharge from your vagina for a few days. HOME CARE INSTRUCTIONS  Slowly return to your normal activities .  No tubs baths  Use sanitary pads, not tampons,  Or sexual intercourse until bleeding subsided.  Keep all follow-up appointments as directed by your health care provider.  Call their office and see if  And when they wish to see you. SEEK MEDICAL CARE IF:  You have prolonged, bright red bleeding.  You have a bad smell or discharge coming from the vagina.  You have very bad pain.  You have a fever. SEEK IMMEDIATE MEDICAL CARE IF:  You pass out.  You have chest pain or shortness of breath. Document Released: 09/06/2010 Document Revised: 10/09/2012 Document Reviewed: 07/18/2012 [facility] Patient Information 2015 [place], [place]. This information is not intended to replace advice given to you by your health care provider. Make sure you discuss any questions you have with your health care provider.   Conscious Sedation, Adult, Care After Refer to this sheet in the next few weeks. These instructions provide you with information on caring for yourself after your procedure. Your health care provider may also give you more specific instructions. Your treatment has been planned according to current medical practices, but problems sometimes occur. Call your health care provider if you have any problems or  questions after your procedure. WHAT TO EXPECT AFTER THE PROCEDURE  After your procedure:  You may feel sleepy, clumsy, and have poor balance for several hours.  Vomiting may occur if you eat too soon after the procedure. HOME CARE INSTRUCTIONS  Do not participate in any activities where you could become injured for at least 24 hours. Do not:  Drive.  Swim.  Ride a bicycle.  Operate heavy machinery.  Cook.  Use power tools.  Climb ladders.  Work from a high place.  Do not make important decisions or sign legal documents until you are improved.  If you vomit, drink water, juice, or soup when you can drink without vomiting. Make sure you have little or no nausea before eating solid foods.  Only take over-the-counter or prescription medicines for pain, discomfort, or fever as directed by your health care provider.  Make sure you and your family fully understand everything about the medicines given to you, including what side effects may occur.  You should not drink alcohol, take sleeping pills, or take medicines that cause drowsiness for at least 24 hours.  If you smoke, do not smoke without supervision.  If you are feeling better, you may resume normal activities 24 hours after you were sedated.  Keep all appointments with your health care provider. SEEK MEDICAL CARE IF:  Your skin is pale or bluish in color.  You continue to feel nauseous or vomit.  Your pain is getting worse and is not helped by medicine.  You have bleeding or swelling.  You are still sleepy or feeling clumsy after 24 hours. SEEK IMMEDIATE MEDICAL CARE IF:  You develop  a rash.  You have difficulty breathing.  You develop any type of allergic problem.  You have a fever. MAKE SURE YOU:  Understand these instructions.  Will watch your condition.  Will get help right away if you are not doing well or get worse. Document Released: 10/09/2012 Document Reviewed: 10/09/2012 [facility]  Patient Information 2015 [place], [place]. This information is not intended to replace advice given to you by your health care provider. Make sure you discuss any questions you have with your health care provider.

## 2014-01-15 NOTE — Progress Notes (Signed)
Chief Complaint: Uterine fibroids, infertility, left fallopian tube occlusion.  Referring Physician(s): Shick,Michael  History of Present Illness: Eileen Patel is a 34 y.o. female who is here today for a left fallopian tube recannulization. See H&P on 11/13/14. She denies any chest pain, shortness of breath or palpitations. She denies any active signs of bleeding or excessive bruising. She denies any recent fever or chills. The patient denies any history of sleep apnea or chronic oxygen use.   Past Medical History  Diagnosis Date  . Vaginal fibroids     pt reports hx of ABD pain with  fibroids  . Anemia     Past Surgical History  Procedure Laterality Date  . Cesarean section  2008  . Diagnostic laparoscopy      ectopic preg  . Wisdom tooth extraction    . Robot assisted myomectomy N/A 10/10/2012    Procedure: ROBOTIC ASSISTED MYOMECTOMY WITH CHRMOPER TUBATION;  Surgeon: Alwyn Pea, MD;  Location: [facility];  Service: Gynecology;  Laterality: N/A;    Allergies: Review of patient's allergies indicates no known allergies.  Medications: Prior to Admission medications   Medication Sig Start Date End Date Taking? Authorizing Provider  cephALEXin (KEFLEX) 500 MG capsule Take 1 capsule (500 mg total) by mouth 4 (four) times daily. Patient not taking: Reported on 01/12/2014 10/04/13   Varney Biles, MD  doxycycline (VIBRAMYCIN) 100 MG capsule Take 100 mg by mouth 2 (two) times daily. Take bid starting two days before procedure.    Historical Provider, MD  HYDROcodone-acetaminophen (NORCO/VICODIN) 5-325 MG per tablet Take 1 tablet by mouth every 6 (six) hours as needed for moderate pain.    Historical Provider, MD  ibuprofen (ADVIL,MOTRIN) 600 MG tablet Take 600 mg by mouth every 6 (six) hours as needed for mild pain.    Historical Provider, MD  ondansetron (ZOFRAN ODT) 8 MG disintegrating tablet Take 1 tablet (8 mg total) by mouth every 8 (eight) hours as needed for  nausea. Patient not taking: Reported on 01/12/2014 10/04/13   Varney Biles, MD  phenazopyridine (PYRIDIUM) 200 MG tablet Take 1 tablet (200 mg total) by mouth 3 (three) times daily. Patient not taking: Reported on 01/12/2014 10/04/13   Varney Biles, MD    History reviewed. No pertinent family history.  History   Social History  . Marital Status: Single    Spouse Name: N/A    Number of Children: N/A  . Years of Education: N/A   Social History Main Topics  . Smoking status: Never Smoker   . Smokeless tobacco: Never Used  . Alcohol Use: No  . Drug Use: No  . Sexual Activity: Yes    Birth Control/ Protection: None   Other Topics Concern  . None   Social History Narrative    Review of Systems: A 12 point ROS discussed and pertinent positives are indicated in the HPI above.  All other systems are negative.  Review of Systems  Vital Signs: BP 141/78 mmHg  Pulse 65  Temp(Src) 98.4 F (36.9 C) (Oral)  Resp 16  SpO2 99%  LMP 01/01/2014 (Exact Date)  Physical Exam  Constitutional: She is oriented to person, place, and time. No distress.  HENT:  Head: Normocephalic and atraumatic.  Cardiovascular: Normal rate and regular rhythm.  Exam reveals no gallop and no friction rub.   No murmur heard. Pulmonary/Chest: Effort normal and breath sounds normal. No respiratory distress. She has no wheezes. She has no rales.  Abdominal: Soft. Bowel sounds are  normal. She exhibits no distension. There is no tenderness.  Neurological: She is alert and oriented to person, place, and time.  Skin: She is not diaphoretic.    Imaging: No results found.  Labs:  CBC:  Recent Labs  10/01/13 1836 10/03/13 2255 01/12/14 0800  WBC 12.2* 5.2 5.3  HGB 11.1* 10.3* 10.3*  HCT 33.9* 31.4* 32.2*  PLT 154 157 180    COAGS:  Recent Labs  01/12/14 0800  INR 1.00  APTT 26    BMP:  Recent Labs  10/01/13 1836 10/03/13 2255 01/12/14 0800  NA 138 138 135  K 3.6* 3.9 4.0  CL 102  104 107  CO2 20 19 23   GLUCOSE 101* 95 98  BUN 9 9 15   CALCIUM 9.7 9.0 9.1  CREATININE 0.91 0.93 0.67  GFRNONAA 83* 80* >90  GFRAA >90 >90 >90    LIVER FUNCTION TESTS:  Recent Labs  10/01/13 1836 10/03/13 2255  BILITOT 0.8 0.3  AST 10 35  ALT 10 38*  ALKPHOS 60 69  PROT 7.8 8.4*  ALBUMIN 3.6 3.3*    Assessment and Plan: Scheduled today for left fallopian tube recannulization Seen 01/12/14 with H&P. The patient has had a H&P performed within the last 30 days, all history, medications, and exam have been reviewed. The patient denies any interval changes since the H&P. Patient has been NPO, labs reviewed Risks and Benefits discussed with the patient. All of the patient's questions were answered, patient is agreeable to proceed. Consent signed and in chart.      SignedHedy Jacob 01/15/2014, 11:11 AM

## 2016-03-19 ENCOUNTER — Encounter (HOSPITAL_COMMUNITY): Payer: Self-pay | Admitting: Emergency Medicine

## 2016-03-19 ENCOUNTER — Emergency Department (HOSPITAL_COMMUNITY): Payer: Self-pay

## 2016-03-19 ENCOUNTER — Emergency Department (HOSPITAL_COMMUNITY)
Admission: EM | Admit: 2016-03-19 | Discharge: 2016-03-19 | Disposition: A | Discharge status: Home / Self Care | Payer: Self-pay | Attending: Emergency Medicine | Admitting: Emergency Medicine

## 2016-03-19 DIAGNOSIS — J020 Streptococcal pharyngitis: Secondary | ICD-10-CM | POA: Insufficient documentation

## 2016-03-19 DIAGNOSIS — Z79899 Other long term (current) drug therapy: Secondary | ICD-10-CM | POA: Insufficient documentation

## 2016-03-19 LAB — RAPID STREP SCREEN (MED CTR MEBANE ONLY): STREPTOCOCCUS, GROUP A SCREEN (DIRECT): POSITIVE — AB

## 2016-03-19 LAB — CBC
HEMATOCRIT: 29.7 % — AB (ref 36.0–46.0)
Hemoglobin: 9.3 g/dL — ABNORMAL LOW (ref 12.0–15.0)
MCH: 23 pg — ABNORMAL LOW (ref 26.0–34.0)
MCHC: 31.3 g/dL (ref 30.0–36.0)
MCV: 73.3 fL — ABNORMAL LOW (ref 78.0–100.0)
Platelets: 216 10*3/uL (ref 150–400)
RBC: 4.05 MIL/uL (ref 3.87–5.11)
RDW: 18.6 % — ABNORMAL HIGH (ref 11.5–15.5)
WBC: 8 10*3/uL (ref 4.0–10.5)

## 2016-03-19 LAB — BASIC METABOLIC PANEL
Anion gap: 6 (ref 5–15)
BUN: 12 mg/dL (ref 6–20)
CALCIUM: 8.8 mg/dL — AB (ref 8.9–10.3)
CO2: 22 mmol/L (ref 22–32)
Chloride: 111 mmol/L (ref 101–111)
Creatinine, Ser: 0.78 mg/dL (ref 0.44–1.00)
GFR calc Af Amer: >60 mL/min (ref >60)
GLUCOSE: 99 mg/dL (ref 65–99)
POTASSIUM: 3.7 mmol/L (ref 3.5–5.1)
SODIUM: 139 mmol/L (ref 135–145)

## 2016-03-19 LAB — I-STAT TROPONIN, ED: TROPONIN I, POC: 0 ng/mL (ref 0.00–0.08)

## 2016-03-19 MED ORDER — PENICILLIN G BENZATHINE 1200000 UNIT/2ML IM SUSP
1.2000 10*6.[IU] | Freq: Once | INTRAMUSCULAR | Status: AC
  Administered 2016-03-19: 1.2 10*6.[IU] via INTRAMUSCULAR
  Filled 2016-03-19: qty 2

## 2016-03-19 MED ORDER — MORPHINE SULFATE (PF) 4 MG/ML IV SOLN
4.0000 mg | Freq: Once | INTRAVENOUS | Status: AC
  Administered 2016-03-19: 4 mg via INTRAVENOUS
  Filled 2016-03-19: qty 1

## 2016-03-19 MED ORDER — MAGIC MOUTHWASH
5.0000 mL | Freq: Once | ORAL | Status: AC
  Administered 2016-03-19: 5 mL via ORAL
  Filled 2016-03-19: qty 5

## 2016-03-19 MED ORDER — CHLORHEXIDINE GLUCONATE 0.12 % MT SOLN: 15.0000 mL | Freq: Two times a day (BID) | OROMUCOSAL | 0 refills | Status: DC

## 2016-03-19 MED ORDER — HYDROCODONE-ACETAMINOPHEN 7.5-325 MG/15ML PO SOLN: 10.0000 mL | Freq: Four times a day (QID) | ORAL | 0 refills | Status: DC | PRN

## 2016-03-19 NOTE — ED Triage Notes (Signed)
Pt with sore throat since yesterday, patient states it is very painful to swallow and pain travels to L ear. Pt also c/o intermittent mid chest pain that is painful to touch and generalized body aches and chills.

## 2016-03-19 NOTE — ED Provider Notes (Signed)
[facility] DEPT Provider Note   CSN: [id number] Arrival date & time: 03/19/16  0702     History   Chief Complaint Chief Complaint  Patient presents with  . Sore Throat  . Chest Pain    HPI Eileen Patel is a 36 y.o. female.  HPI   36 year old female presents with Complaints of left ear pain and chest pain. Patient report acute onset of pain to her left ear radiates towards her jaw as well as left-sided chest pain that started yesterday morning. Pain is been waxing waning, hurts each time she takes a deep breath, when she swallowed. Pain is described as a sharp and moderate in intensity. She does report shortness of breath only the pain is intense. She denies associated fever, runny nose, sneezing, coughing, bringing her years, loss of hearing, neck pain, back pain, abdominal pain. Denies nausea vomiting diarrhea, denies exertional chest pain or diaphoresis. No significant cardiac history, not a smoker or drinker and no recent sick contact. Denies any prior history of PE or DVT, no recent surgery, prolonged bed rest, active cancer or hemoptysis. Denies any use of oral hormone.  Past Medical History:  Diagnosis Date  . Anemia   . Vaginal fibroids    pt reports hx of ABD pain with  fibroids    Patient Active Problem List   Diagnosis Date Noted  . Back pain 08/06/2010    Past Surgical History:  Procedure Laterality Date  . CESAREAN SECTION  2008  . DIAGNOSTIC LAPAROSCOPY     ectopic preg  . ROBOT ASSISTED MYOMECTOMY N/A 10/10/2012   Procedure: ROBOTIC ASSISTED MYOMECTOMY WITH CHRMOPER TUBATION;  Surgeon: Alwyn Pea, MD;  Location: [facility];  Service: Gynecology;  Laterality: N/A;  . WISDOM TOOTH EXTRACTION      OB History    Gravida Para Term Preterm AB Living   6 1 1  0 5 1   SAB TAB Ectopic Multiple Live Births   5 0 0 0         Home Medications    Prior to Admission medications   Medication Sig Start Date End Date Taking? Authorizing Provider    doxycycline (VIBRAMYCIN) 100 MG capsule Take 100 mg by mouth 2 (two) times daily. Take bid starting two days before procedure.    Historical Provider, MD  ibuprofen (ADVIL,MOTRIN) 600 MG tablet Take 600 mg by mouth every 6 (six) hours as needed for mild pain.    Historical Provider, MD    Family History History reviewed. No pertinent family history.  Social History Social History  Substance Use Topics  . Smoking status: Never Smoker  . Smokeless tobacco: Never Used  . Alcohol use No     Allergies   Patient has no known allergies.   Review of Systems Review of Systems  All other systems reviewed and are negative.    Physical Exam Updated Vital Signs BP (!) 156/102 (BP Location: Left Arm)   Pulse 87   Temp 98.8 F (37.1 C) (Oral)   Resp 18   LMP 03/09/2016   SpO2 100%   Physical Exam  Constitutional: She appears well-developed and well-nourished. No distress.  HENT:  Head: Atraumatic.  Right Ear: External ear normal.  Left Ear: External ear normal.  Ears: Normal TMs bilaterally with normal ear canal no significant pain with manipulation of ear lobes Nose: Normal nares Throat: Uvula is midline, mild erythema noted to left tonsil pillar without any significant tonsillar enlargement or exudates, no trismus  Eyes:  Conjunctivae are normal.  Neck: Normal range of motion. Neck supple. No tracheal deviation present.  Cardiovascular: Normal rate and regular rhythm.   Pulmonary/Chest: Effort normal and breath sounds normal. No respiratory distress. She has no wheezes. She has no rales. She exhibits no tenderness (No significant chest wall tenderness on palpation, no crepitus or emphysema.).  Abdominal: Soft. She exhibits no distension. There is no tenderness.  Lymphadenopathy:    She has no cervical adenopathy.  Neurological: She is alert.  Skin: No rash noted.  Psychiatric: She has a normal mood and affect.  Nursing note and vitals reviewed.    ED Treatments /  Results  Labs (all labs ordered are listed, but only abnormal results are displayed) Labs Reviewed  RAPID STREP SCREEN (NOT AT [facility]) - Abnormal; Notable for the following:       Result Value   Streptococcus, Group A Screen (Direct) POSITIVE (*)    All other components within normal limits  BASIC METABOLIC PANEL - Abnormal; Notable for the following:    Calcium 8.8 (*)    All other components within normal limits  CBC - Abnormal; Notable for the following:    Hemoglobin 9.3 (*)    HCT 29.7 (*)    MCV 73.3 (*)    MCH 23.0 (*)    RDW 18.6 (*)    All other components within normal limits  I-STAT TROPOININ, ED    EKG  EKG Interpretation None       Radiology No results found.  Procedures Procedures (including critical care time)  Medications Ordered in ED Medications  morphine 4 MG/ML injection 4 mg (4 mg Intravenous Given 03/19/16 0750)  magic mouthwash (5 mLs Oral Given 03/19/16 0900)  penicillin g benzathine (BICILLIN LA) 1200000 UNIT/2ML injection 1.2 Million Units (1.2 Million Units Intramuscular Given 03/19/16 0908)  morphine 4 MG/ML injection 4 mg (4 mg Intravenous Given 03/19/16 0908)     Initial Impression / Assessment and Plan / ED Course  I have reviewed the triage vital signs and the nursing notes.  Pertinent labs & imaging results that were available during my care of the patient were reviewed by me and considered in my medical decision making (see chart for details).     BP 140/85 (BP Location: Left Arm)   Pulse 86   Temp 98.8 F (37.1 C) (Oral)   Resp 18   LMP 03/09/2016   SpO2 100%    Final Clinical Impressions(s) / ED Diagnoses   Final diagnoses:  Strep pharyngitis    New Prescriptions New Prescriptions   CHLORHEXIDINE (PERIDEX) 0.12 % SOLUTION    Use as directed 15 mLs in the mouth or throat 2 (two) times daily.   HYDROCODONE-ACETAMINOPHEN (HYCET) 7.5-325 MG/15 ML SOLUTION    Take 10 mLs by mouth every 6 (six) hours as needed for moderate  pain or severe pain.   7:42 AM Patient here with pleuritic chest pain, as well as left ear pain. She does not have any URI symptoms. Her ear exam is unremarkable. Report pleuritic chest pain but denies any associate shortness of breath. She otherwise does not have any significant risk factor for PE or DVT. Her pain is atypical for ACS. Throat is mildly erythematous, rapid strep test obtained. We'll perform a d-dimer to screen for potential early, and we'll obtain labs to rule out acute emergent medical condition. Pain medication given.  10:46 AM Strep test is positive. No evidence concerning for peritonsillar abscess. Airway is intact. Labs otherwise reassuring. Evidence  of anemia with hemoglobin of 9.3. Chest x-ray unremarkable. Patient receiving pain medication, magic mouthwash, as well as penicillin shot while in the ER. Patient discharged with pain medication and Peridex oral solution. I did review the [place] controlled substance reporting system to ensure compliance with narcotic prescription.   Domenic Moras, PA-C 03/19/16 [place] Liu, MD 03/19/16 [phone]

## 2016-06-05 ENCOUNTER — Emergency Department (HOSPITAL_COMMUNITY)
Admission: EM | Admit: 2016-06-05 | Discharge: 2016-06-06 | Disposition: A | Discharge status: Home / Self Care | Payer: Self-pay | Attending: Emergency Medicine | Admitting: Emergency Medicine

## 2016-06-05 ENCOUNTER — Encounter (HOSPITAL_COMMUNITY): Payer: Self-pay | Admitting: Emergency Medicine

## 2016-06-05 ENCOUNTER — Emergency Department (HOSPITAL_COMMUNITY): Payer: Self-pay

## 2016-06-05 DIAGNOSIS — G8929 Other chronic pain: Secondary | ICD-10-CM | POA: Insufficient documentation

## 2016-06-05 DIAGNOSIS — Z79899 Other long term (current) drug therapy: Secondary | ICD-10-CM | POA: Insufficient documentation

## 2016-06-05 DIAGNOSIS — M545 Low back pain: Secondary | ICD-10-CM | POA: Insufficient documentation

## 2016-06-05 LAB — POC URINE PREG, ED: Preg Test, Ur: NEGATIVE

## 2016-06-05 MED ORDER — OXYCODONE-ACETAMINOPHEN 5-325 MG PO TABS
1.0000 | ORAL_TABLET | Freq: Once | ORAL | Status: AC
  Administered 2016-06-05: 1 via ORAL
  Filled 2016-06-05: qty 1

## 2016-06-05 MED ORDER — DIAZEPAM 5 MG PO TABS
5.0000 mg | ORAL_TABLET | Freq: Once | ORAL | Status: AC
  Administered 2016-06-05: 5 mg via ORAL
  Filled 2016-06-05: qty 1

## 2016-06-05 MED ORDER — HYDROMORPHONE HCL 1 MG/ML IJ SOLN
1.0000 mg | Freq: Once | INTRAMUSCULAR | Status: AC
  Administered 2016-06-06: 1 mg via INTRAMUSCULAR
  Filled 2016-06-05: qty 1

## 2016-06-05 NOTE — ED Notes (Signed)
Assisted patient to the restroom via wheelchair. Pt's family member is assisting patient.

## 2016-06-05 NOTE — ED Provider Notes (Signed)
[facility] DEPT Provider Note   CSN: [id number] Arrival date & time: 06/05/16  2009     History   Chief Complaint Chief Complaint  Patient presents with  . Back Pain  . Leg Pain    HPI Eileen Patel is a 36 y.o. female.  HPI   Patient is a 36 year old female with history of anemia and chronic low back pain who presents to the ED with complaint of worsening low back pain. Patient reports having chronic intermittent low back pain for the past 5-10 years since having her son but states over the past week it has gradually worsened. She reports having constant intense pain across her lower back with intermittent sharp stabbing pain. She reports that the pain intermittently radiates down the back of both of her legs. She notes the pain is worse with movement. Reports taking Tylenol at home without relief. Pt denies fever, numbness, tingling, saddle anesthesia, loss of bowel or bladder, weakness, IVDU, cancer or recent spinal manipulation. Denies any recent fall, trauma or injury.  Past Medical History:  Diagnosis Date  . Anemia   . Vaginal fibroids    pt reports hx of ABD pain with  fibroids    Patient Active Problem List   Diagnosis Date Noted  . Back pain 08/06/2010    Past Surgical History:  Procedure Laterality Date  . CESAREAN SECTION  2008  . DIAGNOSTIC LAPAROSCOPY     ectopic preg  . ROBOT ASSISTED MYOMECTOMY N/A 10/10/2012   Procedure: ROBOTIC ASSISTED MYOMECTOMY WITH CHRMOPER TUBATION;  Surgeon: Alwyn Pea, MD;  Location: [facility];  Service: Gynecology;  Laterality: N/A;  . WISDOM TOOTH EXTRACTION      OB History    Gravida Para Term Preterm AB Living   6 1 1  0 5 1   SAB TAB Ectopic Multiple Live Births   5 0 0 0         Home Medications    Prior to Admission medications   Medication Sig Start Date End Date Taking? Authorizing Provider  chlorhexidine (PERIDEX) 0.12 % solution Use as directed 15 mLs in the mouth or throat 2 (two) times daily.  03/19/16   Domenic Moras, PA-C  ferrous sulfate 325 (65 FE) MG tablet Take 325 mg by mouth daily with breakfast.    [provider]  HYDROcodone-acetaminophen (HYCET) 7.5-325 mg/15 ml solution Take 10 mLs by mouth every 6 (six) hours as needed for moderate pain or severe pain. 03/19/16   Domenic Moras, PA-C  ibuprofen (ADVIL,MOTRIN) 600 MG tablet Take 1 tablet (600 mg total) by mouth every 6 (six) hours as needed. 06/06/16   Nona Dell, PA-C  methocarbamol (ROBAXIN) 500 MG tablet Take 1 tablet (500 mg total) by mouth 2 (two) times daily. 06/06/16   Nona Dell, PA-C  oxyCODONE-acetaminophen (PERCOCET/ROXICET) 5-325 MG tablet Take 1 tablet by mouth every 4 (four) hours as needed for severe pain. 06/06/16   Nona Dell, PA-C    Family History History reviewed. No pertinent family history.  Social History Social History  Substance Use Topics  . Smoking status: Never Smoker  . Smokeless tobacco: Never Used  . Alcohol use No     Allergies   Patient has no known allergies.   Review of Systems Review of Systems  Musculoskeletal: Positive for back pain.  All other systems reviewed and are negative.    Physical Exam Updated Vital Signs BP 129/81 (BP Location: Left Arm)   Pulse 79  Temp 98.9 F (37.2 C) (Oral)   Resp 19   Ht 5\' 4"  (1.626 m)   Wt 90.7 kg (200 lb)   LMP 05/12/2016 Comment: neg preg test  SpO2 100%   BMI 34.33 kg/m   Physical Exam  Constitutional: She is oriented to person, place, and time. She appears well-developed and well-nourished.  Pt appears to be in mild discomfort and is continuing to change position in chair.  HENT:  Head: Normocephalic and atraumatic.  Eyes: Conjunctivae and EOM are normal. Right eye exhibits no discharge. Left eye exhibits no discharge. No scleral icterus.  Neck: Normal range of motion. Neck supple.  Cardiovascular: Normal rate, regular rhythm, normal heart sounds and intact distal pulses.     Pulmonary/Chest: Effort normal and breath sounds normal. No respiratory distress. She has no wheezes. She has no rales. She exhibits no tenderness.  Abdominal: Soft. She exhibits no distension. There is no tenderness.  Musculoskeletal: She exhibits tenderness. She exhibits no edema or deformity.  No midline C, T tenderness. TTP over midline lumbar spine and bilateral lumbar paraspinal muscles. Full range of motion of neck and dec ROM of back due to pain. Full range of motion of bilateral upper and lower extremities, with 5/5 strength. Sensation intact. 2+ radial and PT pulses. Cap refill <2 seconds. Patient able to stand and ambulate but endorses pain.  Neurological: She is alert and oriented to person, place, and time. She displays normal reflexes. No sensory deficit.  Skin: Skin is warm and dry. She is not diaphoretic.  Nursing note and vitals reviewed.    ED Treatments / Results  Labs (all labs ordered are listed, but only abnormal results are displayed) Labs Reviewed  POC URINE PREG, ED    EKG  EKG Interpretation None       Radiology Dg Lumbar Spine Complete  Result Date: 06/05/2016 CLINICAL DATA:  Low back pain radiating down bilateral legs. EXAM: LUMBAR SPINE - COMPLETE 4+ VIEW COMPARISON:  None. FINDINGS: There is no evidence of lumbar spine fracture. Minimal anterolisthesis of L4 on L5 and L5 on S1 with osteoarthritic changes at both levels with disc space narrowing, and endplate sclerosis. The neural foramina are not well evaluated at these levels. IMPRESSION: Osteoarthritic changes at L4-L5 and L5-S1 with grade 1 anterolisthesis. Electronically Signed   By: Fidela Salisbury M.D.   On: 06/05/2016 23:23    Procedures Procedures (including critical care time)  Medications Ordered in ED Medications  oxyCODONE-acetaminophen (PERCOCET/ROXICET) 5-325 MG per tablet 1 tablet (1 tablet Oral Given 06/05/16 2212)  diazepam (VALIUM) tablet 5 mg (5 mg Oral Given 06/05/16 2212)   HYDROmorphone (DILAUDID) injection 1 mg (1 mg Intramuscular Given 06/06/16 0025)     Initial Impression / Assessment and Plan / ED Course  I have reviewed the triage vital signs and the nursing notes.  Pertinent labs & imaging results that were available during my care of the patient were reviewed by me and considered in my medical decision making (see chart for details).     Patient presents with acute on chronic back pain for the past week. Denies any recent fall, trauma or injury. No back pain red flags. VSS. Exam revealed tenderness over midline lumbar spine and bilateral lumbar paraspinal muscles. No neuro deficits. Patient given pain meds and muscle relaxant in the ED. Lumbar spine x-ray revealed osteoarthritic changes at L4-L5 and L5-L1 with grade 1 anterolisthesis. On reevaluation patient reports mild improvement of pain. Patient can walk but states is painful.  No loss of bowel or bladder control.  No concern for cauda equina.  No fever, night sweats, weight loss, h/o cancer, IVDU.  Discussed results and plan for discharge with patient. Plan discharge patient home with pain meds, muscle relaxant, NSAIDs and symptomatic treatment. Advised to follow up with PCP for further management of chronic back pain. Discussed return precautions.  Final Clinical Impressions(s) / ED Diagnoses   Final diagnoses:  Chronic bilateral low back pain, with sciatica presence unspecified    New Prescriptions New Prescriptions   IBUPROFEN (ADVIL,MOTRIN) 600 MG TABLET    Take 1 tablet (600 mg total) by mouth every 6 (six) hours as needed.   METHOCARBAMOL (ROBAXIN) 500 MG TABLET    Take 1 tablet (500 mg total) by mouth 2 (two) times daily.   OXYCODONE-ACETAMINOPHEN (PERCOCET/ROXICET) 5-325 MG TABLET    Take 1 tablet by mouth every 4 (four) hours as needed for severe pain.     Nona Dell, PA-C 06/06/16 0052    Lacretia Leigh, MD 06/09/16 [phone]

## 2016-06-05 NOTE — ED Triage Notes (Signed)
Patient c/o lower back pain radiating down bilateral legs x1 week worsening today. Denies numbness/tingling, urinary sx, and loss of bowel/bladder. Movement to bilateral lower extremities. Reports pain worsens with movement.

## 2016-06-05 NOTE — ED Notes (Signed)
Gave report to [employer]. RN

## 2016-06-06 MED ORDER — METHOCARBAMOL 500 MG PO TABS: 500.0000 mg | ORAL_TABLET | Freq: Two times a day (BID) | ORAL | 0 refills | Status: DC

## 2016-06-06 MED ORDER — IBUPROFEN 600 MG PO TABS: 600.0000 mg | ORAL_TABLET | Freq: Four times a day (QID) | ORAL | 0 refills | Status: DC | PRN

## 2016-06-06 MED ORDER — OXYCODONE-ACETAMINOPHEN 5-325 MG PO TABS: 1.0000 | ORAL_TABLET | ORAL | 0 refills | Status: DC | PRN

## 2016-06-06 NOTE — Discharge Instructions (Signed)
Take your medications as prescribed. You may also apply ice and/or heat to affected area for 15-20 minutes 3-4 times daily for additional pain relief. I recommend refraining from doing any heavy lifting, squatting or repetitive movements that exacerbate her symptoms for the next few days. °Follow-up with your primary care provider in the next week if her symptoms have not improved. °Please return to the Emergency Department if symptoms worsen or new onset of denies fever, numbness, tingling, groin anesthesia, loss of bowel or bladder, weakness, chest pain, abdominal pain, vomiting. °

## 2017-03-06 ENCOUNTER — Emergency Department (HOSPITAL_COMMUNITY)
Admission: EM | Admit: 2017-03-06 | Discharge: 2017-03-06 | Disposition: A | Discharge status: Home / Self Care | Payer: No Typology Code available for payment source | Attending: Emergency Medicine | Admitting: Emergency Medicine

## 2017-03-06 ENCOUNTER — Other Ambulatory Visit: Payer: Self-pay

## 2017-03-06 ENCOUNTER — Encounter (HOSPITAL_COMMUNITY): Payer: Self-pay | Admitting: Emergency Medicine

## 2017-03-06 DIAGNOSIS — M545 Low back pain: Secondary | ICD-10-CM | POA: Insufficient documentation

## 2017-03-06 DIAGNOSIS — M542 Cervicalgia: Secondary | ICD-10-CM | POA: Insufficient documentation

## 2017-03-06 DIAGNOSIS — Y92481 Parking lot as the place of occurrence of the external cause: Secondary | ICD-10-CM | POA: Insufficient documentation

## 2017-03-06 DIAGNOSIS — Z79899 Other long term (current) drug therapy: Secondary | ICD-10-CM | POA: Diagnosis not present

## 2017-03-06 DIAGNOSIS — Y999 Unspecified external cause status: Secondary | ICD-10-CM | POA: Diagnosis not present

## 2017-03-06 DIAGNOSIS — Y9389 Activity, other specified: Secondary | ICD-10-CM | POA: Insufficient documentation

## 2017-03-06 LAB — POC URINE PREG, ED: Preg Test, Ur: NEGATIVE

## 2017-03-06 MED ORDER — METHOCARBAMOL 500 MG PO TABS: 500.0000 mg | ORAL_TABLET | Freq: Two times a day (BID) | ORAL | 0 refills | Status: DC

## 2017-03-06 MED ORDER — KETOROLAC TROMETHAMINE 30 MG/ML IJ SOLN
30.0000 mg | Freq: Once | INTRAMUSCULAR | Status: AC
  Administered 2017-03-06: 30 mg via INTRAMUSCULAR
  Filled 2017-03-06: qty 1

## 2017-03-06 MED ORDER — ACETAMINOPHEN 500 MG PO TABS
1000.0000 mg | ORAL_TABLET | Freq: Once | ORAL | Status: AC
  Administered 2017-03-06: 1000 mg via ORAL
  Filled 2017-03-06: qty 2

## 2017-03-06 MED ORDER — MELOXICAM 15 MG PO TABS: 15.0000 mg | ORAL_TABLET | Freq: Every day | ORAL | 0 refills | Status: AC

## 2017-03-06 MED ORDER — METHOCARBAMOL 500 MG PO TABS
500.0000 mg | ORAL_TABLET | Freq: Once | ORAL | Status: AC
  Administered 2017-03-06: 500 mg via ORAL
  Filled 2017-03-06: qty 1
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
## 2018-06-12 ENCOUNTER — Inpatient Hospital Stay (HOSPITAL_COMMUNITY)
Admission: AD | Admit: 2018-06-12 | Discharge: 2018-06-12 | Disposition: A | Discharge status: Home / Self Care | Payer: Self-pay | Source: Ambulatory Visit | Attending: Obstetrics and Gynecology | Admitting: Obstetrics and Gynecology

## 2018-06-12 ENCOUNTER — Other Ambulatory Visit: Payer: Self-pay

## 2018-06-12 DIAGNOSIS — N920 Excessive and frequent menstruation with regular cycle: Secondary | ICD-10-CM

## 2018-06-12 DIAGNOSIS — N939 Abnormal uterine and vaginal bleeding, unspecified: Secondary | ICD-10-CM | POA: Insufficient documentation

## 2018-06-12 DIAGNOSIS — R1031 Right lower quadrant pain: Secondary | ICD-10-CM | POA: Insufficient documentation

## 2018-06-12 DIAGNOSIS — Z3202 Encounter for pregnancy test, result negative: Secondary | ICD-10-CM | POA: Insufficient documentation

## 2018-06-12 LAB — POCT PREGNANCY, URINE: Preg Test, Ur: NEGATIVE
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
## 2019-05-16 ENCOUNTER — Other Ambulatory Visit: Payer: Self-pay

## 2019-05-16 ENCOUNTER — Emergency Department (HOSPITAL_COMMUNITY)
Admission: EM | Admit: 2019-05-16 | Discharge: 2019-05-16 | Disposition: A | Discharge status: Home / Self Care | Payer: 59 | Attending: Emergency Medicine | Admitting: Emergency Medicine

## 2019-05-16 ENCOUNTER — Encounter (HOSPITAL_COMMUNITY): Payer: Self-pay

## 2019-05-16 DIAGNOSIS — Z79899 Other long term (current) drug therapy: Secondary | ICD-10-CM | POA: Insufficient documentation

## 2019-05-16 DIAGNOSIS — M5442 Lumbago with sciatica, left side: Secondary | ICD-10-CM | POA: Diagnosis present

## 2019-05-16 DIAGNOSIS — M5441 Lumbago with sciatica, right side: Secondary | ICD-10-CM

## 2019-05-16 DIAGNOSIS — R1084 Generalized abdominal pain: Secondary | ICD-10-CM

## 2019-05-16 MED ORDER — OXYCODONE-ACETAMINOPHEN 5-325 MG PO TABS
1.0000 | ORAL_TABLET | Freq: Once | ORAL | Status: AC
  Administered 2019-05-16: 1 via ORAL
  Filled 2019-05-16: qty 1

## 2019-05-16 MED ORDER — METHOCARBAMOL 500 MG PO TABS: 1000.0000 mg | ORAL_TABLET | Freq: Three times a day (TID) | ORAL | 0 refills | Status: DC

## 2019-05-16 MED ORDER — OXYCODONE-ACETAMINOPHEN 7.5-325 MG PO TABS: 1.0000 | ORAL_TABLET | Freq: Four times a day (QID) | ORAL | 0 refills | Status: DC | PRN

## 2019-05-16 MED ORDER — METHOCARBAMOL 500 MG PO TABS
1000.0000 mg | ORAL_TABLET | Freq: Once | ORAL | Status: AC
  Administered 2019-05-16: 1000 mg via ORAL
  Filled 2019-05-16: qty 2

## 2019-05-16 NOTE — ED Notes (Signed)
Patient verbalizes understanding of discharge instructions. Opportunity for questioning and answers were provided. Armband removed by staff, pt discharged from ED stable & ambulatory  
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
## 2019-05-16 NOTE — ED Triage Notes (Signed)
Pt arrives to ED w/ c/o lower back pain radiating into her legs and abdomen that started on Saturday. Pt denies urinary symptoms. Denies injury/trauma. Pt reports 10/10 pain.

## 2019-05-16 NOTE — Discharge Instructions (Addendum)
You have chosen to leave the emergency department after evaluation for back pain that radiates to the abdomen, similar to previous episodes. Your pain was treated but continues without improvement.   Please know you can return to the emergency department if you would like further evaluation/treatment, or if there are new or worsening symptoms.

## 2019-07-14 NOTE — H&P (Signed)
Eileen Patel is an 39 y.o. [phone] female with uterine fibroids here for scheduled myomectomy. Pt has had a history of recurrent second trimester miscarriages. Recent US noted multiple anterior and fundal fibroids with some displacing endometrium; could not rule out endometrial polyp ( largest fundal fibroid was 6.6cm and the ant fibroid was 7cm) . She reports menorrhagia and has iron deficiency anemia. She would like to preserve option of future fertility  Pertinent Gynecological History: Menses: flow is excessive with use of 7-8 pads or tampons on heaviest days Bleeding: menorrhagia Contraception: none DES exposure: unknown Blood transfusions: none Sexually transmitted diseases: neg Previous GYN Procedures: Laparoscopic myomectomy, right salpingectomy due to ectopic, recanalization of left fallopian tube,   Last mammogram: n/a Date: n/a Last pap: normal Date: pending on 7/14 OB History: G7, P1061   Menstrual History: Menarche age: unsure No LMP recorded.    Past Medical History:  Diagnosis Date  . Anemia   . Vaginal fibroids    pt reports hx of ABD pain with  fibroids    Past Surgical History:  Procedure Laterality Date  . CESAREAN SECTION  2008  . DIAGNOSTIC LAPAROSCOPY     ectopic preg  . ROBOT ASSISTED MYOMECTOMY N/A 10/10/2012   Procedure: ROBOTIC ASSISTED MYOMECTOMY WITH CHRMOPER TUBATION;  Surgeon: Alwyn Pea, MD;  Location: [facility];  Service: Gynecology;  Laterality: N/A;  . WISDOM TOOTH EXTRACTION      No family history on file.  Social History:  reports that she has never smoked. She has never used smokeless tobacco. She reports that she does not drink alcohol and does not use drugs.  Allergies: No Known Allergies  No medications prior to admission.    Review of Systems  There were no vitals taken for this visit. Physical Exam  No results found for this or any previous visit (from the past 24 hour(s)).  No results  found.  Assessment/Plan: 38yo G7P1061 female with fibroid uterus, menorrhagia and iron deficiency anemia for abdominal myomectomy, possible chromopertubation -Admit -Sars covid screen -Type and screen -NPO per ERAS -SCDs to OR -Confirm consent prior to OR; To OR when ready Isaiah Serge 07/14/2019, 11:36 AM

## 2019-07-22 ENCOUNTER — Encounter (HOSPITAL_COMMUNITY): Payer: 59

## 2019-07-24 ENCOUNTER — Ambulatory Visit (HOSPITAL_COMMUNITY)
Admission: RE | Admit: 2019-07-24 | Discharge: 2019-07-24 | Disposition: A | Discharge status: Home / Self Care | Payer: 59 | Source: Ambulatory Visit | Attending: Internal Medicine | Admitting: Internal Medicine

## 2019-07-24 ENCOUNTER — Other Ambulatory Visit: Payer: Self-pay

## 2019-07-24 DIAGNOSIS — D649 Anemia, unspecified: Secondary | ICD-10-CM | POA: Diagnosis present

## 2019-07-24 MED ORDER — SODIUM CHLORIDE 0.9 % IV SOLN
510.0000 mg | Freq: Once | INTRAVENOUS | Status: AC
  Administered 2019-07-24: 09:00:00 510 mg via INTRAVENOUS
  Filled 2019-07-24: qty 510

## 2019-07-24 MED ORDER — SODIUM CHLORIDE 0.9 % IV SOLN
INTRAVENOUS | Status: DC | PRN
  Administered 2019-07-24: 08:00:00 250 mL via INTRAVENOUS

## 2019-07-24 NOTE — Progress Notes (Signed)
PATIENT CARE CENTER NOTE  Diagnosis: Anemia D64.9   Provider: Carlynn Purl, MD   Procedure: Feraheme infusion   Note: Patient received Feraheme infusion via PIV. Patient's BP elevated pre-infusion at 143/76. Dr. Ivor Costa office notified of elevated BP and gave verbal order received to administer infusion. Patient tolerated well with no adverse reaction. Patient observed for 30 minutes post-infusion. Vital signs stable. Discharge instructions given. Patient to come back next week for second iron infusion. Alert, oriented and ambulatory at discharge.

## 2019-07-24 NOTE — Discharge Instructions (Signed)

## 2019-07-29 ENCOUNTER — Other Ambulatory Visit: Payer: Self-pay

## 2019-07-29 ENCOUNTER — Encounter (HOSPITAL_COMMUNITY)
Admission: RE | Admit: 2019-07-29 | Discharge: 2019-07-29 | Disposition: A | Discharge status: Home / Self Care | Payer: 59 | Source: Ambulatory Visit | Attending: Obstetrics and Gynecology | Admitting: Obstetrics and Gynecology

## 2019-07-29 ENCOUNTER — Encounter (HOSPITAL_COMMUNITY): Payer: Self-pay

## 2019-07-29 DIAGNOSIS — Z01812 Encounter for preprocedural laboratory examination: Secondary | ICD-10-CM | POA: Diagnosis not present

## 2019-07-29 LAB — CBC
HCT: 33.6 % — ABNORMAL LOW (ref 36.0–46.0)
Hemoglobin: 9.8 g/dL — ABNORMAL LOW (ref 12.0–15.0)
MCH: 22.1 pg — ABNORMAL LOW (ref 26.0–34.0)
MCHC: 29.2 g/dL — ABNORMAL LOW (ref 30.0–36.0)
MCV: 75.7 fL — ABNORMAL LOW (ref 80.0–100.0)
Platelets: 211 10*3/uL (ref 150–400)
RBC: 4.44 MIL/uL (ref 3.87–5.11)
RDW: 23 % — ABNORMAL HIGH (ref 11.5–15.5)
WBC: 8.5 10*3/uL (ref 4.0–10.5)
nRBC: 0.5 % — ABNORMAL HIGH (ref 0.0–0.2)

## 2019-07-29 NOTE — Progress Notes (Signed)
PCP - no PCP Cardiologist - patient denies  PPM/ICD - n/a Device Orders -  Rep Notified -   Chest x-ray - n/a EKG - n/a Stress Test - patient denies ECHO - patient denies Cardiac Cath - patient denies  Sleep Study - patient denies CPAP -   Fasting Blood Sugar -  n/a Checks Blood Sugar _____ times a day  Blood Thinner Instructions: n/a Aspirin Instructions:n/a  ERAS Protcol - clears until 0430 PRE-SURGERY Ensure or G2- no drink ordered  COVID TEST- Friday 08/01/2019   Anesthesia review:  n/a  Patient denies shortness of breath, fever, cough and chest pain at PAT appointment   All instructions explained to the patient, with a verbal understanding of the material. Patient agrees to go over the instructions while at home for a better understanding. Patient also instructed to self quarantine after being tested for COVID-19. The opportunity to ask questions was provided.

## 2019-07-29 NOTE — Progress Notes (Signed)
[place], [place] [address] Phone: [phone] Fax: [phone]      Your procedure is scheduled on Monday, 08/04/2019.  Report to [facility] Main Entrance "A" at 05:30 A.M., and check in at the Admitting office.  Call this number if you have problems the morning of surgery:  [phone]  Call [phone] if you have any questions prior to your surgery date Monday-Friday 8am-4pm    Remember:  Do not eat after midnight the night before your surgery  You may drink clear liquids until 04:30am the morning of your surgery.   Clear liquids allowed are: Water, Non-Citrus Juices (without pulp), Carbonated Beverages, Clear Tea, Black Coffee Only, and Gatorade    Do not take any medications the morning of surgery.   As of today, STOP taking any Aspirin (unless otherwise instructed by your surgeon) Aleve, Naproxen, Ibuprofen, Motrin, Advil, Goody's, BC's, all herbal medications, fish oil, and all vitamins.                      Do not wear jewelry, make up, or nail polish            Do not wear lotions, powders, perfumes, or deodorant.            Do not shave 48 hours prior to surgery.  Men may shave face and neck.            Do not bring valuables to the hospital.            [facility] is not responsible for any belongings or valuables.  Do NOT Smoke (Tobacco/Vaping) or drink Alcohol 24 hours prior to your procedure  If you use a CPAP at night, you may bring all equipment for your overnight stay.   Contacts, glasses, dentures or bridgework may not be worn into surgery.      For patients admitted to the hospital, discharge time will be determined by your treatment team.   Patients discharged the day of surgery will not be allowed to drive home, and someone needs to stay with them for 24 hours.    Special instructions:   [facility]- Preparing For Surgery  Before surgery, you can play an  important role. Because skin is not sterile, your skin needs to be as free of germs as possible. You can reduce the number of germs on your skin by washing with CHG (chlorahexidine gluconate) Soap before surgery.  CHG is an antiseptic cleaner which kills germs and bonds with the skin to continue killing germs even after washing.    Oral Hygiene is also important to reduce your risk of infection.  Remember - BRUSH YOUR TEETH THE MORNING OF SURGERY WITH YOUR REGULAR TOOTHPASTE  Please do not use if you have an allergy to CHG or antibacterial soaps. If your skin becomes reddened/irritated stop using the CHG.  Do not shave (including legs and underarms) for at least 48 hours prior to first CHG shower. It is OK to shave your face.  Please follow these instructions carefully.   1. Shower the NIGHT BEFORE SURGERY and the MORNING OF SURGERY with CHG Soap.   2. If you chose to wash your hair, wash your hair first as usual with your normal shampoo.  3. After you shampoo, rinse your hair and body thoroughly to remove the shampoo.  4. Use CHG as you would any other liquid soap. You can  apply CHG directly to the skin and wash gently with a scrungie or a clean washcloth.   5. Apply the CHG Soap to your body ONLY FROM THE NECK DOWN.  Do not use on open wounds or open sores. Avoid contact with your eyes, ears, mouth and genitals (private parts). Wash Face and genitals (private parts)  with your normal soap.   6. Wash thoroughly, paying special attention to the area where your surgery will be performed.  7. Thoroughly rinse your body with warm water from the neck down.  8. DO NOT shower/wash with your normal soap after using and rinsing off the CHG Soap.  9. Pat yourself dry with a CLEAN TOWEL.  10. Wear CLEAN PAJAMAS to bed the night before surgery  11. Place CLEAN SHEETS on your bed the night of your first shower and DO NOT SLEEP WITH PETS.   Day of Surgery: Shower with CHG soap as directed Wear  Clean/Comfortable clothing the morning of surgery Do not apply any deodorants/lotions.   Remember to brush your teeth WITH YOUR REGULAR TOOTHPASTE.   Please read over the following fact sheets that you were given.

## 2019-07-30 ENCOUNTER — Ambulatory Visit (HOSPITAL_COMMUNITY)
Admission: RE | Admit: 2019-07-30 | Discharge: 2019-07-30 | Disposition: A | Discharge status: Home / Self Care | Payer: 59 | Source: Ambulatory Visit | Attending: Internal Medicine | Admitting: Internal Medicine

## 2019-07-30 DIAGNOSIS — D649 Anemia, unspecified: Secondary | ICD-10-CM | POA: Diagnosis not present

## 2019-07-30 MED ORDER — SODIUM CHLORIDE 0.9 % IV SOLN
510.0000 mg | Freq: Once | INTRAVENOUS | Status: AC
  Administered 2019-07-30: 510 mg via INTRAVENOUS
  Filled 2019-07-30: qty 510

## 2019-07-30 MED ORDER — SODIUM CHLORIDE 0.9 % IV SOLN
INTRAVENOUS | Status: DC | PRN
  Administered 2019-07-30: 250 mL via INTRAVENOUS

## 2019-07-30 NOTE — Progress Notes (Signed)
Patient received IV feraheme as ordered by Carlynn Purl MD. Observed for at least 30 minutes post infusion.Tolerated well, vitals stable, discharge instructions given, verbalized understanding. Patient alert, oriented and ambulatory at the time of discharge.

## 2019-07-30 NOTE — Discharge Instructions (Signed)

## 2019-08-01 ENCOUNTER — Other Ambulatory Visit (HOSPITAL_COMMUNITY)
Admission: RE | Admit: 2019-08-01 | Discharge: 2019-08-01 | Disposition: A | Discharge status: Home / Self Care | Payer: 59 | Source: Ambulatory Visit | Attending: Obstetrics and Gynecology | Admitting: Obstetrics and Gynecology

## 2019-08-01 DIAGNOSIS — Z01812 Encounter for preprocedural laboratory examination: Secondary | ICD-10-CM | POA: Insufficient documentation

## 2019-08-01 DIAGNOSIS — Z20822 Contact with and (suspected) exposure to covid-19: Secondary | ICD-10-CM | POA: Insufficient documentation

## 2019-08-01 LAB — SARS CORONAVIRUS 2 (TAT 6-24 HRS): SARS Coronavirus 2: NEGATIVE

## 2019-08-03 NOTE — Anesthesia Preprocedure Evaluation (Addendum)
Anesthesia Evaluation  Patient identified by MRN, date of birth, ID band Patient awake    Reviewed: Allergy & Precautions, NPO status , Patient's Chart, lab work & pertinent test results  History of Anesthesia Complications Negative for: history of anesthetic complications  Airway Mallampati: II  TM Distance: >3 FB Neck ROM: Full    Dental  (+) Loose,    Pulmonary neg pulmonary ROS,    Pulmonary exam normal        Cardiovascular negative cardio ROS Normal cardiovascular exam     Neuro/Psych negative neurological ROS  negative psych ROS   GI/Hepatic negative GI ROS, Neg liver ROS,   Endo/Other  negative endocrine ROS  Renal/GU negative Renal ROS  negative genitourinary   Musculoskeletal negative musculoskeletal ROS (+)   Abdominal   Peds  Hematology  (+) anemia , Hgb 9.8, plt 211   Anesthesia Other Findings Day of surgery medications reviewed with patient.  Reproductive/Obstetrics Uterine fibroids                            Anesthesia Physical Anesthesia Plan  ASA: II  Anesthesia Plan: General   Post-op Pain Management:    Induction: Intravenous  PONV Risk Score and Plan: 4 or greater and Treatment may vary due to age or medical condition, Scopolamine patch - Pre-op, Midazolam, Ondansetron and Dexamethasone  Airway Management Planned: Oral ETT  Additional Equipment: None  Intra-op Plan:   Post-operative Plan: Extubation in OR  Informed Consent: I have reviewed the patients History and Physical, chart, labs and discussed the procedure including the risks, benefits and alternatives for the proposed anesthesia with the patient or authorized representative who has indicated his/her understanding and acceptance.     Dental advisory given  Plan Discussed with: CRNA  Anesthesia Plan Comments:        Anesthesia Quick Evaluation

## 2019-08-04 ENCOUNTER — Inpatient Hospital Stay (HOSPITAL_COMMUNITY)
Admission: RE | Admit: 2019-08-04 | Discharge: 2019-08-06 | DRG: 742 | Disposition: A | Discharge status: Home / Self Care | Payer: 59 | Source: Ambulatory Visit | Attending: Obstetrics and Gynecology | Admitting: Obstetrics and Gynecology

## 2019-08-04 ENCOUNTER — Encounter (HOSPITAL_COMMUNITY): Payer: Self-pay | Admitting: Obstetrics and Gynecology

## 2019-08-04 ENCOUNTER — Encounter (HOSPITAL_COMMUNITY)
Admission: RE | Disposition: A | Discharge status: Home / Self Care | Payer: Self-pay | Source: Ambulatory Visit | Attending: Obstetrics and Gynecology

## 2019-08-04 ENCOUNTER — Inpatient Hospital Stay (HOSPITAL_COMMUNITY): Payer: 59 | Admitting: Anesthesiology

## 2019-08-04 ENCOUNTER — Other Ambulatory Visit: Payer: Self-pay

## 2019-08-04 ENCOUNTER — Inpatient Hospital Stay (HOSPITAL_COMMUNITY): Payer: 59 | Admitting: Physician Assistant

## 2019-08-04 DIAGNOSIS — N920 Excessive and frequent menstruation with regular cycle: Secondary | ICD-10-CM | POA: Diagnosis present

## 2019-08-04 DIAGNOSIS — Z20822 Contact with and (suspected) exposure to covid-19: Secondary | ICD-10-CM | POA: Diagnosis present

## 2019-08-04 DIAGNOSIS — R252 Cramp and spasm: Secondary | ICD-10-CM | POA: Diagnosis not present

## 2019-08-04 DIAGNOSIS — D62 Acute posthemorrhagic anemia: Secondary | ICD-10-CM | POA: Diagnosis not present

## 2019-08-04 DIAGNOSIS — D696 Thrombocytopenia, unspecified: Secondary | ICD-10-CM | POA: Diagnosis not present

## 2019-08-04 DIAGNOSIS — D509 Iron deficiency anemia, unspecified: Secondary | ICD-10-CM | POA: Diagnosis present

## 2019-08-04 DIAGNOSIS — Z9889 Other specified postprocedural states: Secondary | ICD-10-CM

## 2019-08-04 DIAGNOSIS — D252 Subserosal leiomyoma of uterus: Secondary | ICD-10-CM | POA: Diagnosis present

## 2019-08-04 DIAGNOSIS — K660 Peritoneal adhesions (postprocedural) (postinfection): Secondary | ICD-10-CM | POA: Diagnosis present

## 2019-08-04 DIAGNOSIS — D259 Leiomyoma of uterus, unspecified: Secondary | ICD-10-CM | POA: Diagnosis present

## 2019-08-04 DIAGNOSIS — N946 Dysmenorrhea, unspecified: Secondary | ICD-10-CM | POA: Diagnosis present

## 2019-08-04 HISTORY — PX: MYOMECTOMY: SHX85

## 2019-08-04 HISTORY — PX: LYSIS OF ADHESION: SHX5961

## 2019-08-04 HISTORY — PX: CHROMOPERTUBATION: SHX6288

## 2019-08-04 LAB — CBC
HCT: 24.3 % — ABNORMAL LOW (ref 36.0–46.0)
Hemoglobin: 7.2 g/dL — ABNORMAL LOW (ref 12.0–15.0)
MCH: 23.6 pg — ABNORMAL LOW (ref 26.0–34.0)
MCHC: 29.6 g/dL — ABNORMAL LOW (ref 30.0–36.0)
MCV: 79.7 fL — ABNORMAL LOW (ref 80.0–100.0)
Platelets: 101 10*3/uL — ABNORMAL LOW (ref 150–400)
RBC: 3.05 MIL/uL — ABNORMAL LOW (ref 3.87–5.11)
RDW: 25.9 % — ABNORMAL HIGH (ref 11.5–15.5)
WBC: 22.5 10*3/uL — ABNORMAL HIGH (ref 4.0–10.5)
nRBC: 0 % (ref 0.0–0.2)

## 2019-08-04 LAB — POCT PREGNANCY, URINE: Preg Test, Ur: NEGATIVE

## 2019-08-04 SURGERY — MYOMECTOMY, ABDOMINAL APPROACH
Anesthesia: General

## 2019-08-04 MED ORDER — PROPOFOL 10 MG/ML IV BOLUS
INTRAVENOUS | Status: DC | PRN
  Administered 2019-08-04: 200 mg via INTRAVENOUS

## 2019-08-04 MED ORDER — NALOXONE HCL 0.4 MG/ML IJ SOLN: 0.4000 mg | INTRAMUSCULAR | Status: DC | PRN

## 2019-08-04 MED ORDER — ROCURONIUM BROMIDE 10 MG/ML (PF) SYRINGE
PREFILLED_SYRINGE | INTRAVENOUS | Status: AC
  Filled 2019-08-04: qty 10

## 2019-08-04 MED ORDER — EPHEDRINE 5 MG/ML INJ
INTRAVENOUS | Status: AC
  Filled 2019-08-04: qty 10

## 2019-08-04 MED ORDER — DIPHENHYDRAMINE HCL 12.5 MG/5ML PO ELIX
12.5000 mg | ORAL_SOLUTION | Freq: Four times a day (QID) | ORAL | Status: DC | PRN
  Filled 2019-08-04: qty 5

## 2019-08-04 MED ORDER — PROPOFOL 500 MG/50ML IV EMUL
INTRAVENOUS | Status: DC | PRN
Start: 2019-08-04 — End: 2019-08-04
  Administered 2019-08-04: 50 ug/kg/min via INTRAVENOUS

## 2019-08-04 MED ORDER — LIDOCAINE 2% (20 MG/ML) 5 ML SYRINGE
INTRAMUSCULAR | Status: AC
  Filled 2019-08-04: qty 5

## 2019-08-04 MED ORDER — PHENYLEPHRINE 40 MCG/ML (10ML) SYRINGE FOR IV PUSH (FOR BLOOD PRESSURE SUPPORT)
PREFILLED_SYRINGE | INTRAVENOUS | Status: AC
  Filled 2019-08-04: qty 10

## 2019-08-04 MED ORDER — IBUPROFEN 800 MG PO TABS
800.0000 mg | ORAL_TABLET | Freq: Three times a day (TID) | ORAL | Status: DC
  Administered 2019-08-04 – 2019-08-05 (×4): 800 mg via ORAL
  Filled 2019-08-04 (×5): qty 1

## 2019-08-04 MED ORDER — LACTATED RINGERS IV SOLN: INTRAVENOUS | Status: DC

## 2019-08-04 MED ORDER — MIDAZOLAM HCL 2 MG/2ML IJ SOLN
INTRAMUSCULAR | Status: AC
  Filled 2019-08-04: qty 2

## 2019-08-04 MED ORDER — VASOPRESSIN 20 UNIT/ML IV SOLN
INTRAVENOUS | Status: AC
  Filled 2019-08-04: qty 1

## 2019-08-04 MED ORDER — DEXAMETHASONE SODIUM PHOSPHATE 10 MG/ML IJ SOLN
INTRAMUSCULAR | Status: AC
  Filled 2019-08-04: qty 1

## 2019-08-04 MED ORDER — ONDANSETRON HCL 4 MG/2ML IJ SOLN: 4.0000 mg | Freq: Four times a day (QID) | INTRAMUSCULAR | Status: DC | PRN

## 2019-08-04 MED ORDER — SENNOSIDES-DOCUSATE SODIUM 8.6-50 MG PO TABS: 1.0000 | ORAL_TABLET | Freq: Every evening | ORAL | Status: DC | PRN

## 2019-08-04 MED ORDER — ONDANSETRON HCL 4 MG PO TABS
4.0000 mg | ORAL_TABLET | Freq: Four times a day (QID) | ORAL | Status: DC | PRN
  Filled 2019-08-04: qty 1

## 2019-08-04 MED ORDER — ROCURONIUM BROMIDE 10 MG/ML (PF) SYRINGE
PREFILLED_SYRINGE | INTRAVENOUS | Status: AC
  Filled 2019-08-04: qty 20

## 2019-08-04 MED ORDER — ALUM & MAG HYDROXIDE-SIMETH 200-200-20 MG/5ML PO SUSP: 30.0000 mL | ORAL | Status: DC | PRN

## 2019-08-04 MED ORDER — PROPOFOL 10 MG/ML IV BOLUS
INTRAVENOUS | Status: AC
  Filled 2019-08-04: qty 20

## 2019-08-04 MED ORDER — PHENYLEPHRINE HCL-NACL 10-0.9 MG/250ML-% IV SOLN
INTRAVENOUS | Status: DC | PRN
  Administered 2019-08-04: 30 ug/min via INTRAVENOUS

## 2019-08-04 MED ORDER — FENTANYL CITRATE (PF) 250 MCG/5ML IJ SOLN
INTRAMUSCULAR | Status: DC | PRN
  Administered 2019-08-04: 100 ug via INTRAVENOUS
  Administered 2019-08-04 (×3): 50 ug via INTRAVENOUS

## 2019-08-04 MED ORDER — BUPIVACAINE HCL (PF) 0.25 % IJ SOLN
INTRAMUSCULAR | Status: DC | PRN
  Administered 2019-08-04: 30 mL

## 2019-08-04 MED ORDER — PHENYLEPHRINE 40 MCG/ML (10ML) SYRINGE FOR IV PUSH (FOR BLOOD PRESSURE SUPPORT)
PREFILLED_SYRINGE | INTRAVENOUS | Status: DC | PRN
  Administered 2019-08-04 (×2): 80 ug via INTRAVENOUS
  Administered 2019-08-04: 120 ug via INTRAVENOUS
  Administered 2019-08-04: 80 ug via INTRAVENOUS
  Administered 2019-08-04 (×2): 120 ug via INTRAVENOUS
  Administered 2019-08-04 (×3): 80 ug via INTRAVENOUS

## 2019-08-04 MED ORDER — FENTANYL CITRATE (PF) 250 MCG/5ML IJ SOLN
INTRAMUSCULAR | Status: AC
  Filled 2019-08-04: qty 5

## 2019-08-04 MED ORDER — ONDANSETRON HCL 4 MG/2ML IJ SOLN
INTRAMUSCULAR | Status: AC
  Filled 2019-08-04: qty 2

## 2019-08-04 MED ORDER — SODIUM CHLORIDE (PF) 0.9 % IJ SOLN
INTRAMUSCULAR | Status: AC
  Filled 2019-08-04: qty 50

## 2019-08-04 MED ORDER — CHLORHEXIDINE GLUCONATE 0.12 % MT SOLN
15.0000 mL | Freq: Once | OROMUCOSAL | Status: AC
  Administered 2019-08-04: 15 mL via OROMUCOSAL
  Filled 2019-08-04: qty 15

## 2019-08-04 MED ORDER — SUCCINYLCHOLINE CHLORIDE 200 MG/10ML IV SOSY
PREFILLED_SYRINGE | INTRAVENOUS | Status: AC
  Filled 2019-08-04: qty 10

## 2019-08-04 MED ORDER — METHYLENE BLUE 0.5 % INJ SOLN
INTRAVENOUS | Status: DC | PRN
  Administered 2019-08-04: 2 mL via SUBMUCOSAL

## 2019-08-04 MED ORDER — MIDAZOLAM HCL 2 MG/2ML IJ SOLN
INTRAMUSCULAR | Status: DC | PRN
  Administered 2019-08-04: 2 mg via INTRAVENOUS

## 2019-08-04 MED ORDER — ROCURONIUM BROMIDE 10 MG/ML (PF) SYRINGE
PREFILLED_SYRINGE | INTRAVENOUS | Status: DC | PRN
  Administered 2019-08-04: 20 mg via INTRAVENOUS
  Administered 2019-08-04 (×2): 10 mg via INTRAVENOUS
  Administered 2019-08-04: 60 mg via INTRAVENOUS
  Administered 2019-08-04: 10 mg via INTRAVENOUS
  Administered 2019-08-04: 20 mg via INTRAVENOUS

## 2019-08-04 MED ORDER — CHEWING GUM (ORBIT) SUGAR FREE
1.0000 | CHEWING_GUM | Freq: Three times a day (TID) | ORAL | Status: DC
  Administered 2019-08-04 – 2019-08-05 (×4): 1 via ORAL
  Filled 2019-08-04 (×3): qty 1

## 2019-08-04 MED ORDER — POVIDONE-IODINE 10 % EX SWAB: 2.0000 "application " | Freq: Once | CUTANEOUS | Status: DC

## 2019-08-04 MED ORDER — OXYCODONE HCL 5 MG PO TABS: 5.0000 mg | ORAL_TABLET | Freq: Once | ORAL | Status: DC | PRN

## 2019-08-04 MED ORDER — METHOCARBAMOL 750 MG PO TABS
750.0000 mg | ORAL_TABLET | Freq: Two times a day (BID) | ORAL | Status: DC
  Administered 2019-08-04 – 2019-08-06 (×5): 750 mg via ORAL
  Filled 2019-08-04 (×5): qty 1

## 2019-08-04 MED ORDER — ACETAMINOPHEN 325 MG PO TABS: 650.0000 mg | ORAL_TABLET | ORAL | Status: DC | PRN

## 2019-08-04 MED ORDER — METHYLENE BLUE 0.5 % INJ SOLN
INTRAVENOUS | Status: AC
  Filled 2019-08-04: qty 10

## 2019-08-04 MED ORDER — PROMETHAZINE HCL 25 MG/ML IJ SOLN: 6.2500 mg | INTRAMUSCULAR | Status: DC | PRN

## 2019-08-04 MED ORDER — FENTANYL CITRATE (PF) 100 MCG/2ML IJ SOLN
INTRAMUSCULAR | Status: AC
  Administered 2019-08-04: 50 ug
  Filled 2019-08-04: qty 2

## 2019-08-04 MED ORDER — DIPHENHYDRAMINE HCL 50 MG/ML IJ SOLN: 12.5000 mg | Freq: Four times a day (QID) | INTRAMUSCULAR | Status: DC | PRN

## 2019-08-04 MED ORDER — ORAL CARE MOUTH RINSE: 15.0000 mL | Freq: Once | OROMUCOSAL | Status: AC

## 2019-08-04 MED ORDER — SUGAMMADEX SODIUM 200 MG/2ML IV SOLN
INTRAVENOUS | Status: DC | PRN
  Administered 2019-08-04: 200 mg via INTRAVENOUS

## 2019-08-04 MED ORDER — DEXAMETHASONE SODIUM PHOSPHATE 10 MG/ML IJ SOLN
INTRAMUSCULAR | Status: DC | PRN
  Administered 2019-08-04: 10 mg via INTRAVENOUS

## 2019-08-04 MED ORDER — ONDANSETRON HCL 4 MG/2ML IJ SOLN
INTRAMUSCULAR | Status: DC | PRN
  Administered 2019-08-04: 4 mg via INTRAVENOUS

## 2019-08-04 MED ORDER — CEFAZOLIN SODIUM-DEXTROSE 2-3 GM-%(50ML) IV SOLR
INTRAVENOUS | Status: DC | PRN
  Administered 2019-08-04: 2 g via INTRAVENOUS

## 2019-08-04 MED ORDER — OXYCODONE HCL 5 MG PO TABS
5.0000 mg | ORAL_TABLET | ORAL | Status: DC | PRN
  Administered 2019-08-04 – 2019-08-05 (×2): 10 mg via ORAL
  Filled 2019-08-04 (×3): qty 2

## 2019-08-04 MED ORDER — MORPHINE SULFATE 2 MG/ML IV SOLN
INTRAVENOUS | Status: DC
  Administered 2019-08-04: 0 mg via INTRAVENOUS
  Administered 2019-08-05 (×2): 1.5 mg via INTRAVENOUS
  Administered 2019-08-05: 0 mg via INTRAVENOUS
  Filled 2019-08-04: qty 30
  Filled 2019-08-04: qty 50

## 2019-08-04 MED ORDER — ACETAMINOPHEN 500 MG PO TABS
1000.0000 mg | ORAL_TABLET | Freq: Once | ORAL | Status: AC
  Administered 2019-08-04: 1000 mg via ORAL
  Filled 2019-08-04: qty 2

## 2019-08-04 MED ORDER — SCOPOLAMINE 1 MG/3DAYS TD PT72
1.0000 | MEDICATED_PATCH | Freq: Once | TRANSDERMAL | Status: DC
  Administered 2019-08-04: 1.5 mg via TRANSDERMAL
  Filled 2019-08-04: qty 1

## 2019-08-04 MED ORDER — LIDOCAINE 2% (20 MG/ML) 5 ML SYRINGE
INTRAMUSCULAR | Status: DC | PRN
  Administered 2019-08-04: 60 mg via INTRAVENOUS

## 2019-08-04 MED ORDER — OXYCODONE HCL 5 MG/5ML PO SOLN: 5.0000 mg | Freq: Once | ORAL | Status: DC | PRN

## 2019-08-04 MED ORDER — FENTANYL CITRATE (PF) 100 MCG/2ML IJ SOLN: 25.0000 ug | INTRAMUSCULAR | Status: DC | PRN

## 2019-08-04 MED ORDER — CEFAZOLIN SODIUM 1 G IJ SOLR
INTRAMUSCULAR | Status: AC
  Filled 2019-08-04: qty 20

## 2019-08-04 MED ORDER — BUPIVACAINE HCL (PF) 0.25 % IJ SOLN
INTRAMUSCULAR | Status: AC
  Filled 2019-08-04: qty 30

## 2019-08-04 MED ORDER — KETOROLAC TROMETHAMINE 30 MG/ML IJ SOLN: 30.0000 mg | Freq: Once | INTRAMUSCULAR | Status: DC

## 2019-08-04 MED ORDER — SODIUM CHLORIDE 0.9% FLUSH: 9.0000 mL | INTRAVENOUS | Status: DC | PRN

## 2019-08-04 MED ORDER — ALBUMIN HUMAN 5 % IV SOLN: INTRAVENOUS | Status: DC | PRN

## 2019-08-04 MED ORDER — HYDROXYZINE HCL 25 MG PO TABS: 25.0000 mg | ORAL_TABLET | Freq: Four times a day (QID) | ORAL | Status: DC | PRN

## 2019-08-04 MED ORDER — VASOPRESSIN 20 UNIT/ML IV SOLN
INTRAVENOUS | Status: DC | PRN
  Administered 2019-08-04: 68 mL via INTRAMUSCULAR

## 2019-08-04 MED ORDER — ONDANSETRON HCL 4 MG/2ML IJ SOLN
4.0000 mg | Freq: Four times a day (QID) | INTRAMUSCULAR | Status: DC | PRN
  Administered 2019-08-04: 4 mg via INTRAVENOUS

## 2019-08-04 SURGICAL SUPPLY — 43 items
BENZOIN TINCTURE PRP APPL 2/3 (GAUZE/BANDAGES/DRESSINGS) ×4 IMPLANT
CANISTER SUCT 3000ML PPV (MISCELLANEOUS) ×4 IMPLANT
CLOSURE WOUND 1/2 X4 (GAUZE/BANDAGES/DRESSINGS) ×1
DRAIN PENROSE 0.25X18 (DRAIN) ×4 IMPLANT
DRSG OPSITE POSTOP 4X10 (GAUZE/BANDAGES/DRESSINGS) ×4 IMPLANT
DURAPREP 26ML APPLICATOR (WOUND CARE) ×4 IMPLANT
GAUZE 4X4 16PLY RFD (DISPOSABLE) ×4 IMPLANT
GLOVE BIO SURGEON STRL SZ 6.5 (GLOVE) ×3 IMPLANT
GLOVE BIO SURGEONS STRL SZ 6.5 (GLOVE) ×1
GLOVE BIOGEL PI IND STRL 7.0 (GLOVE) ×8 IMPLANT
GLOVE BIOGEL PI INDICATOR 7.0 (GLOVE) ×8
GOWN STRL REUS W/ TWL LRG LVL3 (GOWN DISPOSABLE) ×6 IMPLANT
GOWN STRL REUS W/TWL LRG LVL3 (GOWN DISPOSABLE) ×6
KIT TURNOVER KIT B (KITS) ×4 IMPLANT
MANIPULATOR UTERINE 7CM CLEARV (MISCELLANEOUS) ×4 IMPLANT
NEEDLE HYPO 22GX1.5 SAFETY (NEEDLE) ×4 IMPLANT
NS IRRIG 1000ML POUR BTL (IV SOLUTION) ×4 IMPLANT
PACK ABDOMINAL GYN (CUSTOM PROCEDURE TRAY) ×4 IMPLANT
PAD ARMBOARD 7.5X6 YLW CONV (MISCELLANEOUS) ×4 IMPLANT
PAD OB MATERNITY 4.3X12.25 (PERSONAL CARE ITEMS) ×4 IMPLANT
PENCIL SMOKE EVACUATOR (MISCELLANEOUS) ×4 IMPLANT
RTRCTR C-SECT PINK 25CM LRG (MISCELLANEOUS) ×4 IMPLANT
SEPRAFILM MEMBRANE 5X6 (MISCELLANEOUS) ×4 IMPLANT
SHEET LAVH (DRAPES) ×4 IMPLANT
SPONGE LAP 18X18 RF (DISPOSABLE) ×36 IMPLANT
STRIP CLOSURE SKIN 1/2X4 (GAUZE/BANDAGES/DRESSINGS) ×3 IMPLANT
SUT MON AB 2-0 CT1 36 (SUTURE) ×8 IMPLANT
SUT MON AB 3-0 SH 27 (SUTURE) ×4
SUT MON AB 3-0 SH27 (SUTURE) ×4 IMPLANT
SUT PLAIN 3 0 CT 1 27 (SUTURE) ×4 IMPLANT
SUT VIC AB 0 CT1 18XCR BRD8 (SUTURE) ×4 IMPLANT
SUT VIC AB 0 CT1 36 (SUTURE) ×12 IMPLANT
SUT VIC AB 0 CT1 8-18 (SUTURE) ×4
SUT VIC AB 2-0 CT1 (SUTURE) ×4 IMPLANT
SUT VIC AB 3-0 SH 27 (SUTURE) ×2
SUT VIC AB 3-0 SH 27X BRD (SUTURE) ×2 IMPLANT
SUT VIC AB 4-0 KS 27 (SUTURE) ×4 IMPLANT
SUT VIC AB 4-0 SH 27 (SUTURE) ×6
SUT VIC AB 4-0 SH 27XBRD (SUTURE) ×6 IMPLANT
SYR 50ML LL SCALE MARK (SYRINGE) ×4 IMPLANT
SYR CONTROL 10ML LL (SYRINGE) ×4 IMPLANT
TOWEL GREEN STERILE FF (TOWEL DISPOSABLE) ×8 IMPLANT
TRAY FOLEY W/BAG SLVR 14FR (SET/KITS/TRAYS/PACK) ×4 IMPLANT

## 2019-08-04 NOTE — Op Note (Addendum)
Operative Note    Preoperative Diagnosis: 1. Leimyomatous uterus 2. Dysmenorrhea   3. Menorrhagia    4. Iron deficiency anemia   Postoperative Diagnosis : Same, 5. Intraabdominal adhesions   Procedure: Abdominal myomectomy with extensive lysis of intraabdominal adhesions   Surgeon: Mickle Mallory DO Assist: Kandis Cocking MD   Anesthesia: General   Fluids: LR 2L; 562ml colloid EBL: 697ml UOP: 568ml clear   Findings: Extensive adhesions of bowel to fundus and posterior aspect of uterus. Diffusely enlarged bulbous uterus with multiple fibroids of varying sizes. Three large fibroids in anterior fundal uterus, left lateral lower uterine segment and posterior lower uterus.    Specimens: fibroids to pathology   Procedure Note Pt was taken to operating room where general anesthesia was administered and found to be adequate. Pt was placed in the low dorsal lithotomy position with her arms extended. A time out was performed with all indicators confirmed  per protocol.  Pt's abdomen was then prepped and draped in sterile fashion as well and an appropriate timeout performed. A foley catheter was sterilely placed and a ClearVue uterine manipulator placed. A pfannestial skin incision was made with the scalpel through her previous incision and carried down to the level of the fascia. Small areas of bleeding were coagulated.The fascia was incised in the midline and the incision extended laterally with mayo scissors. Next, the superior aspect of the fascia was elevated with kocher clamps and the underlying rectus muscles dissected away.  In a similar fashion the lower aspect of the incision was also grasped, elevated and the fascia dissected from the rectus. The rectus muscles were separated in the midline and the  peritoneum entered. The alexis retractor was then placed and bowels tucked away with wet lap sponges to ensure good visualization.  Gross survey of the pelvic organs noted a diffusely enlarged  uterus with several subserosal and submucosal fibroids easily palpated. The uterine was limited in mobility due to adhesions of the bowel to the fundal and posterior aspect of the uterus. Thus careful sharp separation of the bowels from the uterus was performed mostly using metzembaum scissors with some filmy areas reduced with a lap sponge. This took about an hour. Dr Lennox Solders alternately provided visualization and also assisted in this process.    Once the adhesions were reduced, an exam noted no injury to bowel serosa. The uterus was then exteriorized and better assessment of the location of fibroids performed. 20ccs of methylene blue were injected via the ClearVue manipulator to stain the uterine cavity. Of note no spill was noted from the fimbria which on further examination were noted as follows: the left tube was noted to have a separation at the end of the isthmic portion from the fimbria possibly suggesting a tubal ligation may have been performed previously., The proximal end of the tube has a hydrosalpinx appearance. Clear fluid was subsequently noted on drainage. The left fallopian tube had a blunt ending tip - no fimbria noted. The possibility of a congenital cause or surgical treatment was suspected.  A penrose drain was used as a tourniquet at the utero-cervical junction to decrease risk of bleeding. Then beginning along the longitudinal focus of the uterus, a mix of 20u pitressin in 50cc  saline was injected and the capsule breeched sharply. A large subserosal fibroid was shelled out. Several smaller fibroids were identified under the large fibroid and were therefore removed. Attention was then turned to an anterior fibroid and a large lateral left fibroid. With care taken  to avoid the lateral uterine vessels, these were removed with liberal use of pitressin to begin. Next a large posterior fiborid was also removed. In total, approximately 27 large and small fibroids were carefully shelled out. A  second injection of methylene blue at this point noted a pinpoint breach into the endometrium at the fundus and a small opening posteriorly. Both were repaired using 4-0 vicryl on an SH needle with a supporting layer to further protect the breeched areas.  The uterus was then subsequently repaired systematically with the large fundal myometrial laceration first. Several interrupted stitches of 0-vicryl were placed in stacked layers from the deep myometrium till the superficial layer. A baseball stitch was placed along the serosa with excellent stability appreciated. All the other uterine lacerations were similarly repaired.  A final injection of methylene blue was performed with no spill noted from repaired sites.  A slurry mix of seprafilm and saline was poured over the repaired uterine sites after copious irrigation performed A final look into the abdominal cavity confirmed no abnormalities or bleeding hence the peritoneum was closed next in a running fashion with 2-0 vicryl, the fascia layer was closed using 0- vicryl also in a running fashion from both ends and overlapping in the middle. The subcuticular layer was undermined, irrigated and reapproximated using interrupted 3-0 vicryl stitches and finally the incision was closed using 4-0 vicryl on a keith needle. A steristrips and a honey comb dressing were applied. Counts were noted to be correct. Patient was awakened and taken to recovery room in stable status. She tolerated the procedure well.  Foley left in place

## 2019-08-04 NOTE — Interval H&P Note (Signed)
History and Physical Interval Note: Pt seen and procedure and expectations post op reviewed No change from H/P Consent confirmed To OR when ready  08/04/2019 7:27 AM  Eileen Patel  has presented today for surgery, with the diagnosis of leiomyoma of uterus.  The various methods of treatment have been discussed with the patient and family. After consideration of risks, benefits and other options for treatment, the patient has consented to  Procedure(s) with comments: ABDOMINAL MYOMECTOMY (N/A) CHROMOPERTUBATION (N/A) - possible as a surgical intervention.  The patient's history has been reviewed, patient examined, no change in status, stable for surgery.  I have reviewed the patient's chart and labs.  Questions were answered to the patient's satisfaction.     Isaiah Serge

## 2019-08-04 NOTE — Anesthesia Postprocedure Evaluation (Signed)
Anesthesia Post Note  Patient: Markella N Godsil  Procedure(s) Performed: ABDOMINAL MYOMECTOMY (N/A ) CHROMOPERTUBATION (N/A ) EXTENSIVE LYSIS OF ADHESIONS     Patient location during evaluation: PACU Anesthesia Type: General Level of consciousness: awake and alert and oriented Pain management: pain level controlled Vital Signs Assessment: post-procedure vital signs reviewed and stable Respiratory status: spontaneous breathing, nonlabored ventilation and respiratory function stable Cardiovascular status: blood pressure returned to baseline Postop Assessment: no apparent nausea or vomiting Anesthetic complications: no   No complications documented.  Last Vitals:  Vitals:   08/04/19 1325 08/04/19 1358  BP:  122/72  Pulse:  89  Resp: 15 16  Temp:  36.9 C  SpO2: 100% 100%                  Brennan Bailey

## 2019-08-04 NOTE — Transfer of Care (Signed)
Immediate Anesthesia Transfer of Care Note  Patient: Eileen Patel  Procedure(s) Performed: ABDOMINAL MYOMECTOMY (N/A ) CHROMOPERTUBATION (N/A ) EXTENSIVE LYSIS OF ADHESIONS  Patient Location: PACU  Anesthesia Type:General  Level of Consciousness: drowsy and patient cooperative  Airway & Oxygen Therapy: Patient Spontanous Breathing  Post-op Assessment: Report given to RN, Post -op Vital signs reviewed and stable and Patient moving all extremities X 4  Post vital signs: Reviewed and stable  Last Vitals:  Vitals Value Taken Time  BP 107/57 08/04/19 1210  Temp    Pulse 94 08/04/19 1212  Resp 21 08/04/19 1212  SpO2 100 % 08/04/19 1212  Vitals shown include unvalidated device data.  Last Pain:  Vitals:   08/04/19 0623  TempSrc:   PainSc: 0-No pain      Patients Stated Pain Goal: 3 (27/80/04 4715)  Complications: No complications documented.

## 2019-08-04 NOTE — Progress Notes (Signed)
Patient ID: Eileen Patel, female   DOB: [birth year], 39 y.o.   MRN: [id number] Pt awake in bed with family at bedside. Pt reports pain controlled however has upper thigh cramping. She has tolerated liquids and ambulated to bathroom. She denies any CP, SOB or fever VS: 122/74, 83 GEN - mild distress with movement as expected ABD - soft, diffusely tender, nondistended EXT - no homans  22.5>7.2<101  A/P: POD#0 s/p abdominal myomectomy with lysis of adhesions         -Routine post op care: add vistaril for nausea                                               Add robaxin for muscle cramping                                               May remove foley in am at 6am 8/3 or upon pt request          - Stop morphine PCA at 6am; start on oral pain medication only ( oxycodone)

## 2019-08-04 NOTE — Anesthesia Procedure Notes (Signed)
Procedure Name: Intubation Date/Time: 08/04/2019 7:50 AM Performed by: Darletta Moll, CRNA Pre-anesthesia Checklist: Patient identified, Emergency Drugs available, Suction available and Patient being monitored Patient Re-evaluated:Patient Re-evaluated prior to induction Oxygen Delivery Method: Circle system utilized Preoxygenation: Pre-oxygenation with 100% oxygen Induction Type: IV induction Ventilation: Mask ventilation without difficulty and Oral airway inserted - appropriate to patient size Laryngoscope Size: Mac and 3 Grade View: Grade I Tube type: Oral Tube size: 7.0 mm Number of attempts: 1 Airway Equipment and Method: Stylet Placement Confirmation: ETT inserted through vocal cords under direct vision,  positive ETCO2 and breath sounds checked- equal and bilateral Secured at: 21 cm Tube secured with: Tape Dental Injury: Teeth and Oropharynx as per pre-operative assessment  Comments: Intubation performed by Audie Pinto, SRNA

## 2019-08-04 NOTE — Brief Op Note (Signed)
08/04/2019  11:56 AM  PATIENT:  Eileen Patel  39 y.o. female  PRE-OPERATIVE DIAGNOSIS:  leiomyoma of uterus  POST-OPERATIVE DIAGNOSIS:  leiomyoma of uterus, lysis of intraabdominal adhesions, prior bilateral salpingectomy  PROCEDURE:  Procedure(s) with comments: ABDOMINAL MYOMECTOMY (N/A) CHROMOPERTUBATION (N/A) - possible EXTENSIVE LYSIS OF ADHESIONS  SURGEON:  Surgeon(s) and Role:    * Israel Werts, Bonnee Quin, DO - Primary    * Shivaji, Melida Quitter, MD - Assisting      ANESTHESIA:   general  EBL:  650 mL   BLOOD ADMINISTERED: 500cc colloid/ 2L LR  DRAINS: Urinary Catheter (Foley)   LOCAL MEDICATIONS USED:  LIDOCAINE   SPECIMEN:  Source of Specimen:  uterus - uterine fibroids - subserosal and submucosal  DISPOSITION OF SPECIMEN:  PATHOLOGY  COUNTS:  YES  TOURNIQUET:  * No tourniquets in log *  DICTATION: .Note written in EPIC  PLAN OF CARE: Admit to inpatient   PATIENT DISPOSITION:  PACU - hemodynamically stable.   Delay start of Pharmacological VTE agent (>24hrs) due to surgical blood loss or risk of bleeding: no

## 2019-08-05 ENCOUNTER — Encounter (HOSPITAL_COMMUNITY): Payer: Self-pay | Admitting: Obstetrics and Gynecology

## 2019-08-05 LAB — CBC
HCT: 19.5 % — ABNORMAL LOW (ref 36.0–46.0)
Hemoglobin: 5.8 g/dL — CL (ref 12.0–15.0)
MCH: 24.1 pg — ABNORMAL LOW (ref 26.0–34.0)
MCHC: 29.7 g/dL — ABNORMAL LOW (ref 30.0–36.0)
MCV: 80.9 fL (ref 80.0–100.0)
Platelets: 90 10*3/uL — ABNORMAL LOW (ref 150–400)
RBC: 2.41 MIL/uL — ABNORMAL LOW (ref 3.87–5.11)
RDW: 26.9 % — ABNORMAL HIGH (ref 11.5–15.5)
WBC: 13.1 10*3/uL — ABNORMAL HIGH (ref 4.0–10.5)
nRBC: 0.2 % (ref 0.0–0.2)

## 2019-08-05 LAB — POCT I-STAT, CHEM 8
BUN: 13 mg/dL (ref 6–20)
Calcium, Ion: 1.16 mmol/L (ref 1.15–1.40)
Chloride: 106 mmol/L (ref 98–111)
Creatinine, Ser: 0.6 mg/dL (ref 0.44–1.00)
Glucose, Bld: 162 mg/dL — ABNORMAL HIGH (ref 70–99)
HCT: 29 % — ABNORMAL LOW (ref 36.0–46.0)
Hemoglobin: 9.9 g/dL — ABNORMAL LOW (ref 12.0–15.0)
Potassium: 5.6 mmol/L — ABNORMAL HIGH (ref 3.5–5.1)
Sodium: 139 mmol/L (ref 135–145)
TCO2: 21 mmol/L — ABNORMAL LOW (ref 22–32)

## 2019-08-05 LAB — FIBRINOGEN: Fibrinogen: 428 mg/dL (ref 210–475)

## 2019-08-05 LAB — PROTIME-INR
INR: 1.2 (ref 0.8–1.2)
Prothrombin Time: 14.9 seconds (ref 11.4–15.2)

## 2019-08-05 LAB — PREPARE RBC (CROSSMATCH)

## 2019-08-05 LAB — SURGICAL PATHOLOGY

## 2019-08-05 LAB — APTT: aPTT: 28 seconds (ref 24–36)

## 2019-08-05 MED ORDER — DIPHENHYDRAMINE HCL 25 MG PO CAPS
25.0000 mg | ORAL_CAPSULE | Freq: Once | ORAL | Status: AC
  Administered 2019-08-05: 25 mg via ORAL
  Filled 2019-08-05: qty 1

## 2019-08-05 MED ORDER — KETOROLAC TROMETHAMINE 30 MG/ML IJ SOLN
30.0000 mg | INTRAMUSCULAR | Status: DC | PRN
  Administered 2019-08-06: 30 mg via INTRAVENOUS
  Filled 2019-08-05: qty 1

## 2019-08-05 MED ORDER — OXYCODONE-ACETAMINOPHEN 5-325 MG PO TABS
1.0000 | ORAL_TABLET | ORAL | Status: DC | PRN
  Administered 2019-08-05 – 2019-08-06 (×6): 2 via ORAL
  Filled 2019-08-05 (×7): qty 2

## 2019-08-05 MED ORDER — SODIUM CHLORIDE 0.9% IV SOLUTION: Freq: Once | INTRAVENOUS | Status: DC

## 2019-08-05 MED ORDER — TRAMADOL HCL 50 MG PO TABS
50.0000 mg | ORAL_TABLET | Freq: Four times a day (QID) | ORAL | Status: DC
  Administered 2019-08-05 – 2019-08-06 (×5): 50 mg via ORAL
  Filled 2019-08-05 (×5): qty 1

## 2019-08-05 NOTE — Progress Notes (Signed)
Report called to RN on 6N. Pt transported to 6N10 via wheelchair. All belongings were sent with the Pt and family was at the bedside during transfer. Holli Humbles, RN

## 2019-08-05 NOTE — Progress Notes (Signed)
Patient ID: Eileen Patel, female   DOB: [birth year], 39 y.o.   MRN: [id number] POD#1 Pt reports feeling fatigued and got dizzy when tried to ambulate at 2am today. She repors pain only moderately well controlled. She otherwise denies any chest pain, sob or dysuria since foley removed. Pt has been able to void well. She is tolerating PO  VS: 128/67, 105 GEN - NAD,  ABD - soft, generalized tenderness, dressing c/d/i EXT - no homans  13.1>5.8<90  A/P: POD#1 s/p abdominal myomectomy with lysis of adhesions with secondary anemia due to blood loss  - Transfuse 3 units prbcs ; consent confirmed with pt  - Discontinue tylenol and oxycodone - start on percocet 5-10mg  po q 4 hrs prn pain relief; continue ibuprofen 800mg  po q 8 hrs and robaxin 750mg  po bid  - Routine post operative care - Transfer to 6N if bed becomes available

## 2019-08-05 NOTE — Progress Notes (Signed)
PCA was D/C'd per MD order. Wasted 87mL with Willaim Rayas, RN. Holli Humbles, RN

## 2019-08-05 NOTE — Progress Notes (Signed)
Patient ID: Eileen Patel, female   DOB: [birth year], 39 y.o.   MRN: [id number] POD#1 Pt doing well. Tolerating blood transfusion with no complaints - on second bag.  She has tolerated regular PO diet and ambulated with no dizziness. Still reports fatigue as has not been able to sleep for uninterrupted long spurts of time. She also reports better pain control on percocet  VS: 134/67, 85  GEN - NAD ABD - nondistended EXT - no homans  A/P: POD#1 s/p abdominal myomectomy with lysis of adhesions and anemia        - complete 3 bags of prbcs        - recheck cbc post transfusion        - routine postop care        - discharge home tomorrow if stable

## 2019-08-06 LAB — CBC
HCT: 26.6 % — ABNORMAL LOW (ref 36.0–46.0)
HCT: 27.3 % — ABNORMAL LOW (ref 36.0–46.0)
Hemoglobin: 8.5 g/dL — ABNORMAL LOW (ref 12.0–15.0)
Hemoglobin: 8.9 g/dL — ABNORMAL LOW (ref 12.0–15.0)
MCH: 26.2 pg (ref 26.0–34.0)
MCH: 26.8 pg (ref 26.0–34.0)
MCHC: 32 g/dL (ref 30.0–36.0)
MCHC: 32.6 g/dL (ref 30.0–36.0)
MCV: 81.8 fL (ref 80.0–100.0)
MCV: 82.2 fL (ref 80.0–100.0)
Platelets: 74 10*3/uL — ABNORMAL LOW (ref 150–400)
Platelets: 84 10*3/uL — ABNORMAL LOW (ref 150–400)
RBC: 3.25 MIL/uL — ABNORMAL LOW (ref 3.87–5.11)
RBC: 3.32 MIL/uL — ABNORMAL LOW (ref 3.87–5.11)
RDW: 21.4 % — ABNORMAL HIGH (ref 11.5–15.5)
RDW: 22.1 % — ABNORMAL HIGH (ref 11.5–15.5)
WBC: 12.5 10*3/uL — ABNORMAL HIGH (ref 4.0–10.5)
WBC: 13.2 10*3/uL — ABNORMAL HIGH (ref 4.0–10.5)
nRBC: 0 % (ref 0.0–0.2)
nRBC: 0 % (ref 0.0–0.2)

## 2019-08-06 LAB — BPAM RBC
Blood Product Expiration Date: 202108062359
Blood Product Expiration Date: 202108172359
Blood Product Expiration Date: 202108182359
ISSUE DATE / TIME: 202108031222
ISSUE DATE / TIME: 202108031638
ISSUE DATE / TIME: 202108032040
Unit Type and Rh: 600
Unit Type and Rh: 600
Unit Type and Rh: 9500

## 2019-08-06 LAB — TYPE AND SCREEN
ABO/RH(D): AB NEG
Antibody Screen: NEGATIVE
Unit division: 0
Unit division: 0
Unit division: 0

## 2019-08-06 LAB — PROTIME-INR
INR: 1.1 (ref 0.8–1.2)
Prothrombin Time: 13.8 seconds (ref 11.4–15.2)

## 2019-08-06 LAB — FIBRINOGEN: Fibrinogen: 447 mg/dL (ref 210–475)

## 2019-08-06 LAB — APTT: aPTT: 29 seconds (ref 24–36)

## 2019-08-06 MED ORDER — DOCUSATE SODIUM 100 MG PO CAPS
200.0000 mg | ORAL_CAPSULE | Freq: Once | ORAL | Status: AC
  Administered 2019-08-06: 200 mg via ORAL
  Filled 2019-08-06: qty 2

## 2019-08-06 MED ORDER — DOCUSATE SODIUM 100 MG PO CAPS: 100.0000 mg | ORAL_CAPSULE | Freq: Two times a day (BID) | ORAL | 0 refills | Status: DC

## 2019-08-06 MED ORDER — DOCUSATE SODIUM 100 MG PO CAPS
100.0000 mg | ORAL_CAPSULE | Freq: Two times a day (BID) | ORAL | Status: DC
  Administered 2019-08-06: 100 mg via ORAL
  Filled 2019-08-06: qty 1

## 2019-08-06 MED ORDER — OXYCODONE-ACETAMINOPHEN 5-325 MG PO TABS: 1.0000 | ORAL_TABLET | ORAL | 0 refills | Status: AC | PRN

## 2019-08-06 MED ORDER — PRENATAL MULTIVITAMIN CH: 1.0000 | ORAL_TABLET | ORAL | 1 refills | Status: DC

## 2019-08-06 NOTE — Progress Notes (Signed)
Patient ID: Eileen Patel, female   DOB: [birth year], 39 y.o.   MRN: [id number] Pt reports burning pain in abdomen shortly after took colace however has since resolved. Pain well controlled at this time. She is ready for discharge to home  VS:  149/84, 86 GEN - NAD ABD - binder in place  EXT - no homans   13.2>8.9<84  A/P: POD#2 s/p abdominal myomectomy with lysis of adhesions - stable         Anemia secondary to acute blood loss - s/p 3u prbcs - stable         Transient thrombocytopenia - stable          Discharge instructions reviewed - post op visit on 08/19/19 as scheduled

## 2019-08-06 NOTE — Progress Notes (Signed)
Patient ID: Eileen Patel, female   DOB: 1980-08-22, 39 y.o.   MRN: [id number] Per nurse, pt ambulated around floor this am and tolerated well. Reports no flatus yet. Pain well controlled with meds- all PO now. Tolerating diet and voiding well  VS: 138-143/70-76, HR 89-104  Labs: 12.5>8.5<74  Plan: Ordered cbc to be done now           Spoke to nurse: if Hg and plts stable will see pt and consider discharge home                                      If Hg and/or plts unstable, will need replacement treatment            Routine post op care at this time

## 2019-08-06 NOTE — Progress Notes (Signed)
Pt discharge to home. VSS. Discharge instruction given to the patient. All questions are answered.

## 2019-08-06 NOTE — Progress Notes (Signed)
Patient ID: Eileen Patel, female   DOB: 12-24-1980, 39 y.o.   MRN: [id number] Pt reports labs were just drawn a few mins ago.  She confirms able to ambulate on own without needing assistance. Still has diffuse adbominal pain and no BM yet. Pt tolerating PO meals and meds. She is voiding well. Reports some bleeding with voids. No fever or chills. No lightheadedness or palpitations  VSS  GEN - appears fatigued otherwise well, conversant, eating lunch ABD - diffuse tympany noted, mild distention, tender mostly in left lower quadrant           Dressing c/d/i EXT - no homans   Labs - pending  A/P: POD#2- await results of cbc         Continue current mgmt          Start on colace

## 2019-08-06 NOTE — Discharge Summary (Signed)
Physician Discharge Summary  Patient ID: Eileen Patel MRN: [id number] DOB/AGE: [birth year] 39 y.o.  Admit date: 08/04/2019 Discharge date: 08/06/2019  Admission Diagnoses:  Discharge Diagnoses:  Active Problems:   Fibroid uterus   Post-operative state   Discharged Condition: stable  Hospital Course: Pt recovered well with adjustments to treatment made for adequate pain control. She begun ambulating on day 1 post op, tolerated diet well and voided with no issues after foley removed  Consults: None  Significant Diagnostic Studies: labs: stabilized  Treatments: analgesia: morphine PCA then finally percocet and tramadol and surgery: abdominal myomectomy with lysis of adhesions  Discharge Exam: Blood pressure (!) 149/84, pulse 86, temperature 99 F (37.2 C), temperature source Oral, resp. rate 18, height 5\' 4"  (1.626 m), weight (!) 94.3 kg, SpO2 100 %. General appearance: alert, cooperative and no distress GI: normal findings: binder in place Extremities: extremities normal, atraumatic, no cyanosis or edema Incision/Wound: dressing c/d/i  Disposition: Discharge disposition: 01-Home or Self Care       Discharge Instructions     Remove dressing in 72 hours   Complete by: As directed    May shower, rub over incision lightly and pat dry. Keep clean and dry   Call MD for:  difficulty breathing, headache or visual disturbances   Complete by: As directed    Call MD for:  persistant dizziness or light-headedness   Complete by: As directed    Call MD for:  persistant nausea and vomiting   Complete by: As directed    Call MD for:  redness, tenderness, or signs of infection (pain, swelling, redness, odor or green/yellow discharge around incision site)   Complete by: As directed    Call MD for:  severe uncontrolled pain   Complete by: As directed    Call MD for:  temperature >100.4   Complete by: As directed    Diet - low sodium heart healthy   Complete by: As directed     Driving Restrictions   Complete by: As directed    None while taking narcotic medications   Increase activity slowly   Complete by: As directed    Lifting restrictions   Complete by: As directed    Lift nothing >15lbs   Sexual Activity Restrictions   Complete by: As directed    None for 6 weeks     Allergies as of 08/06/2019   No Known Allergies     Medication List    TAKE these medications   docusate sodium 100 MG capsule Commonly known as: COLACE Take 1 capsule (100 mg total) by mouth 2 (two) times daily. Start taking on: August 07, 2019   ferrous sulfate 325 (65 FE) MG tablet Take 325 mg by mouth daily with breakfast.   oxyCODONE-acetaminophen 5-325 MG tablet Commonly known as: Percocet Take 1 tablet by mouth every 4 (four) hours as needed for up to 7 days for severe pain.   prenatal multivitamin Tabs tablet Take 1 tablet by mouth every other day. What changed: when to take this       [place], [place], DO. Go on 08/19/2019.   Specialty: Obstetrics and Gynecology Contact information: [address] [place] [address] [phone]               Signed: Isaiah Serge 08/06/2019, 7:22 PM

## 2019-08-06 NOTE — Discharge Instructions (Signed)
Call office with any concerns [phone] 

## 2019-08-29 ENCOUNTER — Other Ambulatory Visit: Payer: Self-pay

## 2019-08-29 ENCOUNTER — Emergency Department (HOSPITAL_COMMUNITY): Payer: 59

## 2019-08-29 ENCOUNTER — Encounter (HOSPITAL_COMMUNITY): Payer: Self-pay | Admitting: Emergency Medicine

## 2019-08-29 ENCOUNTER — Emergency Department (HOSPITAL_COMMUNITY)
Admission: EM | Admit: 2019-08-29 | Discharge: 2019-08-30 | Disposition: A | Discharge status: Home / Self Care | Payer: 59 | Attending: Emergency Medicine | Admitting: Emergency Medicine

## 2019-08-29 DIAGNOSIS — N852 Hypertrophy of uterus: Secondary | ICD-10-CM | POA: Insufficient documentation

## 2019-08-29 DIAGNOSIS — R112 Nausea with vomiting, unspecified: Secondary | ICD-10-CM

## 2019-08-29 DIAGNOSIS — R1110 Vomiting, unspecified: Secondary | ICD-10-CM | POA: Insufficient documentation

## 2019-08-29 DIAGNOSIS — D219 Benign neoplasm of connective and other soft tissue, unspecified: Secondary | ICD-10-CM

## 2019-08-29 LAB — COMPREHENSIVE METABOLIC PANEL
ALT: 16 U/L (ref 0–44)
AST: 13 U/L — ABNORMAL LOW (ref 15–41)
Albumin: 4.3 g/dL (ref 3.5–5.0)
Alkaline Phosphatase: 62 U/L (ref 38–126)
Anion gap: 10 (ref 5–15)
BUN: 11 mg/dL (ref 6–20)
CO2: 22 mmol/L (ref 22–32)
Calcium: 9.1 mg/dL (ref 8.9–10.3)
Chloride: 106 mmol/L (ref 98–111)
Creatinine, Ser: 0.7 mg/dL (ref 0.44–1.00)
GFR calc Af Amer: >60 mL/min (ref >60)
GFR calc non Af Amer: >60 mL/min (ref >60)
Glucose, Bld: 84 mg/dL (ref 70–99)
Potassium: 3.4 mmol/L — ABNORMAL LOW (ref 3.5–5.1)
Sodium: 138 mmol/L (ref 135–145)
Total Bilirubin: 0.4 mg/dL (ref 0.3–1.2)
Total Protein: 8.3 g/dL — ABNORMAL HIGH (ref 6.5–8.1)

## 2019-08-29 LAB — CBC
HCT: 39.6 % (ref 36.0–46.0)
Hemoglobin: 12.6 g/dL (ref 12.0–15.0)
MCH: 26.8 pg (ref 26.0–34.0)
MCHC: 31.8 g/dL (ref 30.0–36.0)
MCV: 84.1 fL (ref 80.0–100.0)
Platelets: 191 10*3/uL (ref 150–400)
RBC: 4.71 MIL/uL (ref 3.87–5.11)
RDW: 18.2 % — ABNORMAL HIGH (ref 11.5–15.5)
WBC: 8.2 10*3/uL (ref 4.0–10.5)
nRBC: 0 % (ref 0.0–0.2)

## 2019-08-29 LAB — I-STAT BETA HCG BLOOD, ED (MC, WL, AP ONLY): I-stat hCG, quantitative: <5 m[IU]/mL (ref <5)

## 2019-08-29 LAB — LIPASE, BLOOD: Lipase: 24 U/L (ref 11–51)

## 2019-08-29 MED ORDER — HALOPERIDOL LACTATE 5 MG/ML IJ SOLN
2.0000 mg | Freq: Once | INTRAMUSCULAR | Status: AC
  Administered 2019-08-30: 2 mg via INTRAVENOUS
  Filled 2019-08-29: qty 1

## 2019-08-29 MED ORDER — SODIUM CHLORIDE 0.9 % IV BOLUS
500.0000 mL | Freq: Once | INTRAVENOUS | Status: AC
  Administered 2019-08-30: 500 mL via INTRAVENOUS

## 2019-08-29 NOTE — ED Triage Notes (Signed)
Patient had fibroids removed 8/02 and has been nauseated and unable to keep food down since then. States she has not had a Bm in three days, but she is unable to keep any food down. She was prescribed Zofran, but it is not working.

## 2019-08-30 ENCOUNTER — Encounter (HOSPITAL_COMMUNITY): Payer: Self-pay | Admitting: Emergency Medicine

## 2019-08-30 ENCOUNTER — Emergency Department (HOSPITAL_COMMUNITY): Payer: 59

## 2019-08-30 LAB — URINALYSIS, ROUTINE W REFLEX MICROSCOPIC
Bilirubin Urine: NEGATIVE
Glucose, UA: NEGATIVE mg/dL
Hgb urine dipstick: NEGATIVE
Ketones, ur: 20 mg/dL — AB
Leukocytes,Ua: NEGATIVE
Nitrite: NEGATIVE
Protein, ur: NEGATIVE mg/dL
Specific Gravity, Urine: 1.027 (ref 1.005–1.030)
pH: 5 (ref 5.0–8.0)

## 2019-08-30 MED ORDER — IOHEXOL 300 MG/ML  SOLN
100.0000 mL | Freq: Once | INTRAMUSCULAR | Status: AC | PRN
  Administered 2019-08-30: 100 mL via INTRAVENOUS

## 2019-08-30 MED ORDER — PROMETHAZINE HCL 25 MG PO TABS
25.0000 mg | ORAL_TABLET | Freq: Three times a day (TID) | ORAL | 0 refills | Status: AC | PRN
Start: 2019-08-30 — End: ?

## 2019-08-30 MED ORDER — SODIUM CHLORIDE (PF) 0.9 % IJ SOLN
INTRAMUSCULAR | Status: AC
  Filled 2019-08-30: qty 50

## 2019-08-30 NOTE — ED Provider Notes (Signed)
[facility] DEPT Provider Note   CSN: [id number] Arrival date & time: 08/29/19  1738     History Chief Complaint  Patient presents with  . Emesis  . Nausea    Eileen Patel is a 39 y.o. female.  The history is provided by the patient.  Emesis Severity:  Moderate Duration:  3 weeks Timing:  Sporadic Quality:  Stomach contents Able to tolerate:  Liquids Progression:  Unchanged Chronicity:  New Recent urination:  Normal Context: not post-tussive   Relieved by:  Nothing Worsened by:  Nothing Ineffective treatments:  None tried Associated symptoms: no abdominal pain, no arthralgias and no fever   Risk factors: prior abdominal surgery   Patient had a myomectomy on 8/2 and now has ongoing nausea and emesis post prandial.  Is able to tolerate liquids but not solids.  No f/c/r.       Past Medical History:  Diagnosis Date  . Anemia   . Vaginal fibroids    pt reports hx of ABD pain with  fibroids    Patient Active Problem List   Diagnosis Date Noted  . Fibroid uterus 08/04/2019  . Post-operative state 08/04/2019  . Back pain 08/06/2010    Past Surgical History:  Procedure Laterality Date  . CESAREAN SECTION  2008  . CHROMOPERTUBATION N/A 08/04/2019   Procedure: CHROMOPERTUBATION;  Surgeon: Sherlyn Hay, DO;  Location: [facility];  Service: Gynecology;  Laterality: N/A;  possible  . DIAGNOSTIC LAPAROSCOPY     ectopic preg  . LYSIS OF ADHESION  08/04/2019   Procedure: EXTENSIVE LYSIS OF ADHESIONS;  Surgeon: Sherlyn Hay, DO;  Location: [facility];  Service: Gynecology;;  . MYOMECTOMY N/A 08/04/2019   Procedure: ABDOMINAL MYOMECTOMY;  Surgeon: Sherlyn Hay, DO;  Location: [facility];  Service: Gynecology;  Laterality: N/A;  . ROBOT ASSISTED MYOMECTOMY N/A 10/10/2012   Procedure: ROBOTIC ASSISTED MYOMECTOMY WITH CHRMOPER TUBATION;  Surgeon: Alwyn Pea, MD;  Location: [facility];  Service: Gynecology;  Laterality: N/A;  . WISDOM  TOOTH EXTRACTION       OB History    Gravida  6   Para  1   Term  1   Preterm  0   AB  5   Living  1     SAB  5   TAB  0   Ectopic  0   Multiple  0   Live Births              History reviewed. No pertinent family history.  Social History   Tobacco Use  . Smoking status: Never Smoker  . Smokeless tobacco: Never Used  Vaping Use  . Vaping Use: Never used  Substance Use Topics  . Alcohol use: No  . Drug use: No    Home Medications Prior to Admission medications   Medication Sig Start Date End Date Taking? Authorizing Provider  docusate sodium (COLACE) 100 MG capsule Take 1 capsule (100 mg total) by mouth 2 (two) times daily. 08/07/19   Sherlyn Hay, DO  ferrous sulfate 325 (65 FE) MG tablet Take 325 mg by mouth daily with breakfast.    [provider]  Prenatal Vit-Fe Fumarate-FA (PRENATAL MULTIVITAMIN) TABS tablet Take 1 tablet by mouth every other day. 08/06/19   Sherlyn Hay, DO    Allergies    Patient has no known allergies.  Review of Systems   Review of Systems  Constitutional: Negative for fever.  HENT: Negative for congestion.  Eyes: Negative for visual disturbance.  Respiratory: Negative for shortness of breath.   Cardiovascular: Negative for chest pain.  Gastrointestinal: Positive for vomiting. Negative for abdominal pain.  Genitourinary: Negative for difficulty urinating.  Musculoskeletal: Negative for arthralgias.  Skin: Negative for rash.  Neurological: Negative for dizziness.  Psychiatric/Behavioral: Negative for agitation.  All other systems reviewed and are negative.   Physical Exam Updated Vital Signs BP 137/85 (BP Location: Left Arm)   Pulse 67   Temp 99.4 F (37.4 C) (Oral)   Resp 16   Ht 5\' 4"  (1.626 m)   Wt 97 kg   LMP 08/13/2019   SpO2 100%   BMI 36.71 kg/m   Physical Exam Vitals and nursing note reviewed.  Constitutional:      General: She is not in acute distress.    Appearance:  Normal appearance.  HENT:     Head: Normocephalic and atraumatic.     Nose: Nose normal.  Eyes:     Conjunctiva/sclera: Conjunctivae normal.     Pupils: Pupils are equal, round, and reactive to light.  Cardiovascular:     Rate and Rhythm: Normal rate and regular rhythm.     Pulses: Normal pulses.     Heart sounds: Normal heart sounds.  Pulmonary:     Effort: Pulmonary effort is normal.     Breath sounds: Normal breath sounds.  Abdominal:     General: Abdomen is flat. Bowel sounds are normal.     Tenderness: There is no abdominal tenderness. There is no guarding or rebound.  Musculoskeletal:        General: Normal range of motion.     Cervical back: Normal range of motion and neck supple.  Skin:    General: Skin is warm and dry.     Capillary Refill: Capillary refill takes less than 2 seconds.  Neurological:     General: No focal deficit present.     Mental Status: She is alert and oriented to person, place, and time.     Deep Tendon Reflexes: Reflexes normal.  Psychiatric:        Mood and Affect: Mood normal.        Behavior: Behavior normal.     ED Results / Procedures / Treatments   Labs (all labs ordered are listed, but only abnormal results are displayed) Results for orders placed or performed during the hospital encounter of 08/29/19  Lipase, blood  Result Value Ref Range   Lipase 24 11 - 51 U/L  Comprehensive metabolic panel  Result Value Ref Range   Sodium 138 135 - 145 mmol/L   Potassium 3.4 (L) 3.5 - 5.1 mmol/L   Chloride 106 98 - 111 mmol/L   CO2 22 22 - 32 mmol/L   Glucose, Bld 84 70 - 99 mg/dL   BUN 11 6 - 20 mg/dL   Creatinine, Ser 0.70 0.44 - 1.00 mg/dL   Calcium 9.1 8.9 - 10.3 mg/dL   Total Protein 8.3 (H) 6.5 - 8.1 g/dL   Albumin 4.3 3.5 - 5.0 g/dL   AST 13 (L) 15 - 41 U/L   ALT 16 0 - 44 U/L   Alkaline Phosphatase 62 38 - 126 U/L   Total Bilirubin 0.4 0.3 - 1.2 mg/dL   GFR calc non Af Amer >60 >60 mL/min   GFR calc Af Amer >60 >60 mL/min    Anion gap 10 5 - 15  CBC  Result Value Ref Range   WBC 8.2 4.0 - 10.5 K/uL  RBC 4.71 3.87 - 5.11 MIL/uL   Hemoglobin 12.6 12.0 - 15.0 g/dL   HCT 39.6 36 - 46 %   MCV 84.1 80.0 - 100.0 fL   MCH 26.8 26.0 - 34.0 pg   MCHC 31.8 30.0 - 36.0 g/dL   RDW 18.2 (H) 11.5 - 15.5 %   Platelets 191 150 - 400 K/uL   nRBC 0.0 0.0 - 0.2 %  Urinalysis, Routine w reflex microscopic Urine, Clean Catch  Result Value Ref Range   Color, Urine YELLOW YELLOW   APPearance CLEAR CLEAR   Specific Gravity, Urine 1.027 1.005 - 1.030   pH 5.0 5.0 - 8.0   Glucose, UA NEGATIVE NEGATIVE mg/dL   Hgb urine dipstick NEGATIVE NEGATIVE   Bilirubin Urine NEGATIVE NEGATIVE   Ketones, ur 20 (A) NEGATIVE mg/dL   Protein, ur NEGATIVE NEGATIVE mg/dL   Nitrite NEGATIVE NEGATIVE   Leukocytes,Ua NEGATIVE NEGATIVE  I-Stat beta hCG blood, ED  Result Value Ref Range   I-stat hCG, quantitative <5.0 <5 mIU/mL   Comment 3           CT ABDOMEN PELVIS W CONTRAST  Result Date: 08/30/2019 CLINICAL DATA:  39 year old female with abdominal pain. EXAM: CT ABDOMEN AND PELVIS WITH CONTRAST TECHNIQUE: Multidetector CT imaging of the abdomen and pelvis was performed using the standard protocol following bolus administration of intravenous contrast. CONTRAST:  137mL OMNIPAQUE IOHEXOL 300 MG/ML  SOLN COMPARISON:  None. FINDINGS: Lower chest: The visualized lung bases are clear. No intra-abdominal free air or free fluid. Hepatobiliary: No focal liver abnormality is seen. No gallstones, gallbladder wall thickening, or biliary dilatation. Pancreas: Unremarkable. No pancreatic ductal dilatation or surrounding inflammatory changes. Spleen: Normal in size without focal abnormality. Adrenals/Urinary Tract: The adrenal glands unremarkable. There is no hydronephrosis on either side. There is symmetric enhancement and excretion of contrast by both kidneys. The visualized ureters appear unremarkable. The urinary bladder is collapsed. Stomach/Bowel: There is  no bowel obstruction or active inflammation. The appendix is normal. Vascular/Lymphatic: The abdominal aorta and IVC are unremarkable. Faint nonocclusive peripheral low density in the splenic vein (24/2 and 71/5), likely artifactual and related to mixing. No portal venous gas. There is no adenopathy. Reproductive: Enlarged myomatous uterus. The endometrium is slightly irregular, likely related to submucosal fibroid. Several small low attenuating fibroid, likely degenerative. No adnexal masses. Other: Anterior pelvic wall C-section scar. Musculoskeletal: Degenerative changes at L4-5. No acute osseous pathology. IMPRESSION: 1. No acute intra-abdominal or pelvic pathology. No bowel obstruction. Normal appendix. 2. Enlarged myomatous uterus. Electronically Signed   By: Anner Crete M.D.   On: 08/30/2019 01:23     Radiology No results found.  Procedures Procedures (including critical care time)  Medications Ordered in ED Medications  haloperidol lactate (HALDOL) injection 2 mg (has no administration in time range)  sodium chloride 0.9 % bolus 500 mL (has no administration in time range)    ED Course  I have reviewed the triage vital signs and the nursing notes.  Pertinent labs & imaging results that were available during my care of the patient were reviewed by me and considered in my medical decision making (see chart for details).  Patient po challenged in the ED.  No acute findings on CT.  Stable for discharge with close follow up.  Eileen Patel was evaluated in Emergency Department on 08/30/2019 for the symptoms described in the history of present illness. She was evaluated in the context of the global COVID-19 pandemic, which necessitated consideration that the  patient might be at risk for infection with the SARS-CoV-2 virus that causes COVID-19. Institutional protocols and algorithms that pertain to the evaluation of patients at risk for COVID-19 are in a state of rapid change based  on information released by regulatory bodies including the CDC and federal and state organizations. These policies and algorithms were followed during the patient's care in the ED.    Final Clinical Impression(s) / ED Diagnoses  Return for intractable cough, coughing up blood,fevers >100.4 unrelieved by medication, shortness of breath, intractable vomiting, chest pain, shortness of breath, weakness,numbness, changes in speech, facial asymmetry,abdominal pain, passing out,Inability to tolerate liquids or food, cough, altered mental status or any concerns. No signs of systemic illness or infection. The patient is nontoxic-appearing on exam and vital signs are within normal limits.   I have reviewed the triage vital signs and the nursing notes. Pertinent labs &imaging results that were available during my care of the patient were reviewed by me and considered in my medical decision making (see chart for details).After history, exam, and medical workup I feel the patient has beenappropriately medically screened and is safe for discharge home. Pertinent diagnoses were discussed with the patient. Patient was given return precautions.    Osmin Welz, MD 08/30/19 [phone]

## 2021-01-05 IMAGING — CT CT ABD-PELV W/ CM
3 of 5 series · 16 of 46 positions shown, 18 images · IV contrast (omnipaque)
Comparison: None.

CLINICAL DATA: 39-year-old female with abdominal pain.

EXAM:
CT ABDOMEN AND PELVIS WITH CONTRAST
TECHNIQUE: Multidetector CT imaging of the abdomen and pelvis was performed
using the standard protocol following bolus administration of
intravenous contrast.
CONTRAST:  100mL OMNIPAQUE IOHEXOL 300 MG/ML  SOLN

[Series 2: axial st · axial · 0.79mm/px · z∈[-482,-102]mm · 11 of 92 slices shown, 13 images]
[im 8/92  soft-tissue]
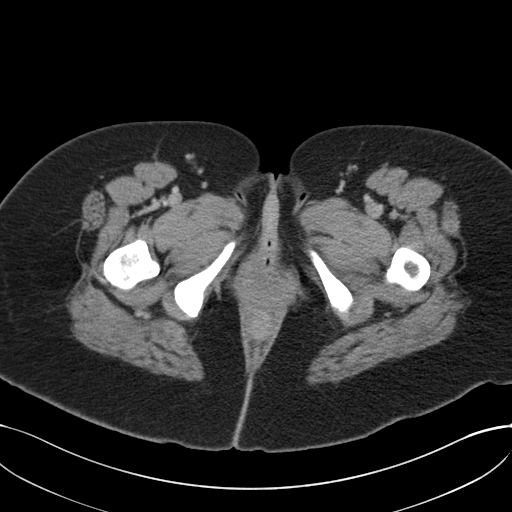
[im 8/92  bone]
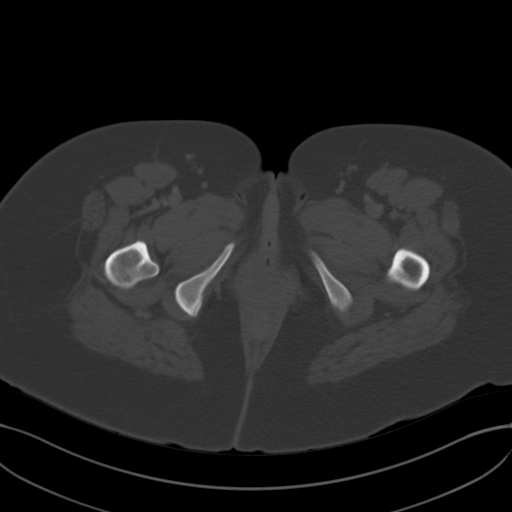
[im 16/92  soft-tissue]
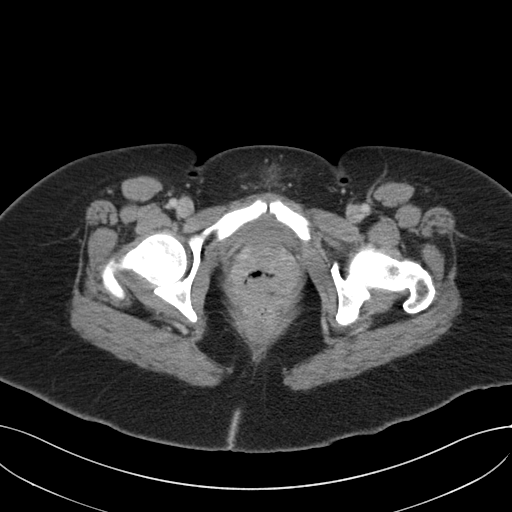
[im 23/92  soft-tissue]
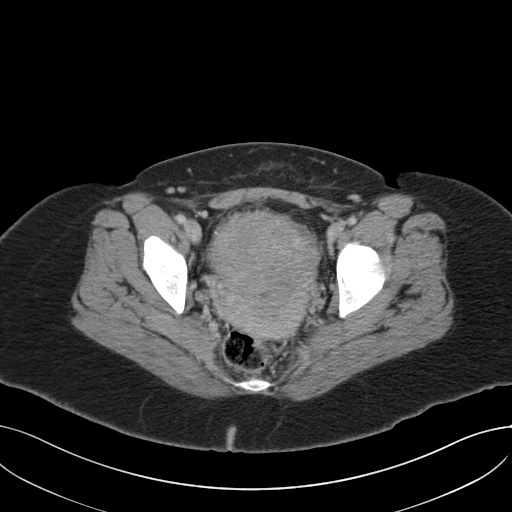
[im 31/92  soft-tissue]
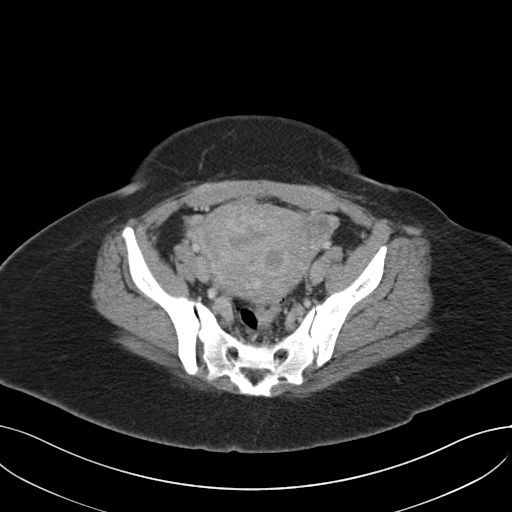
[im 38/92  soft-tissue]
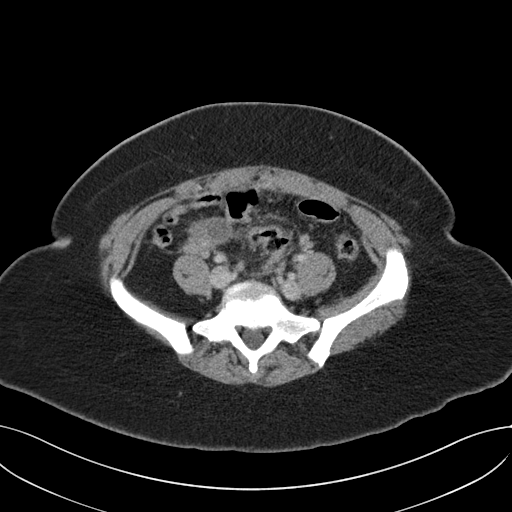
[im 46/92  soft-tissue]
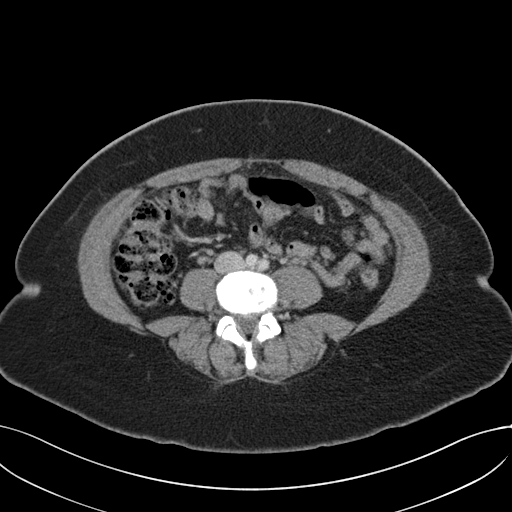
[im 54/92  soft-tissue]
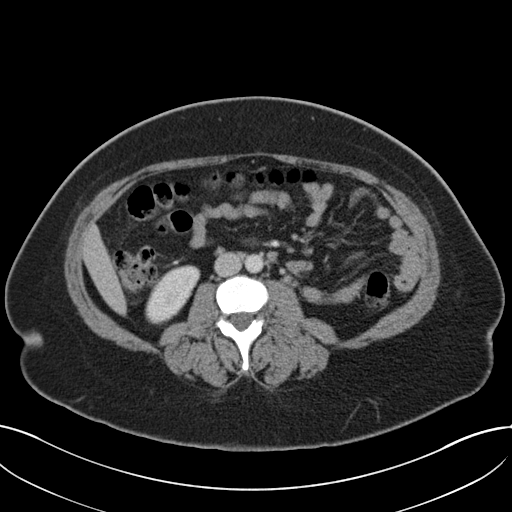
[im 61/92  soft-tissue]
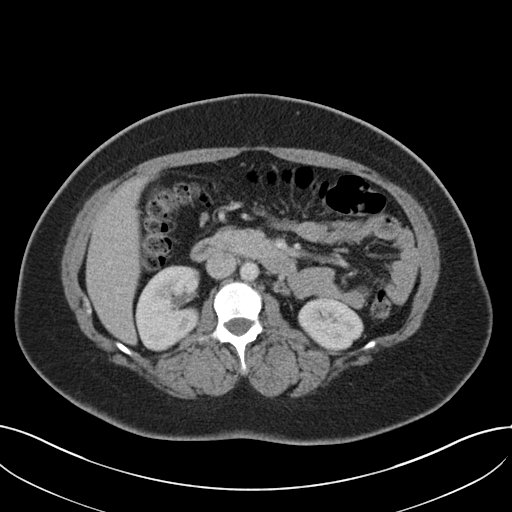
[im 69/92  soft-tissue]
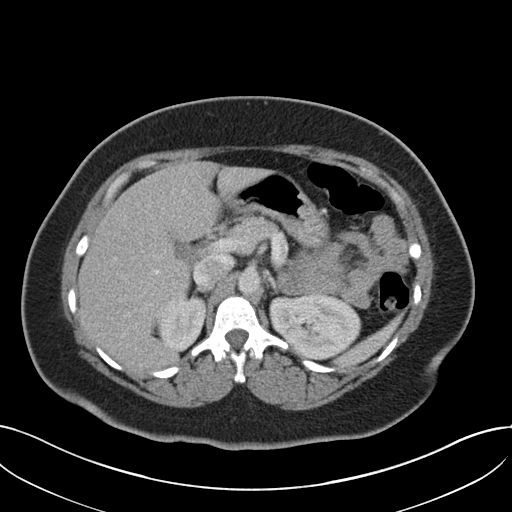
[im 69/92  bone]
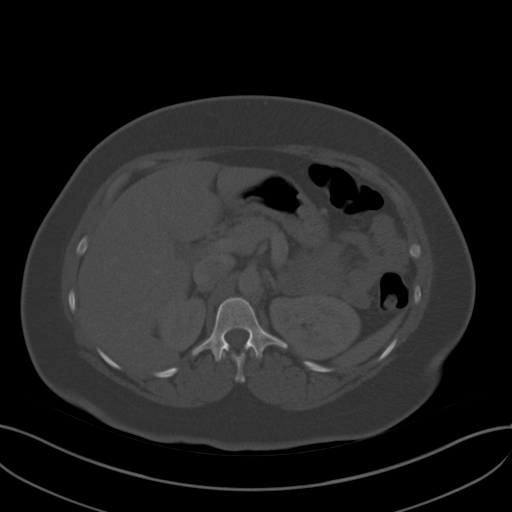
[im 76/92  soft-tissue]
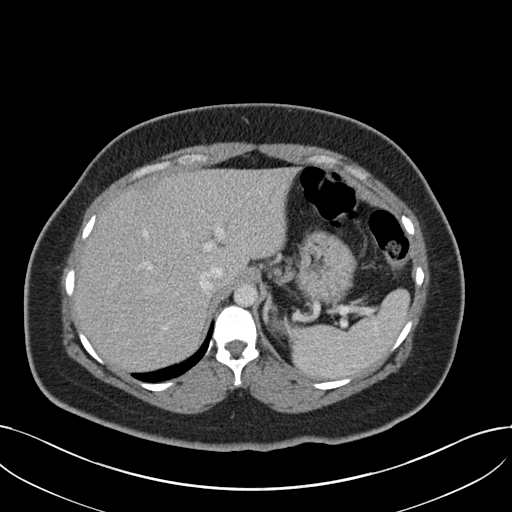
[im 84/92  soft-tissue]
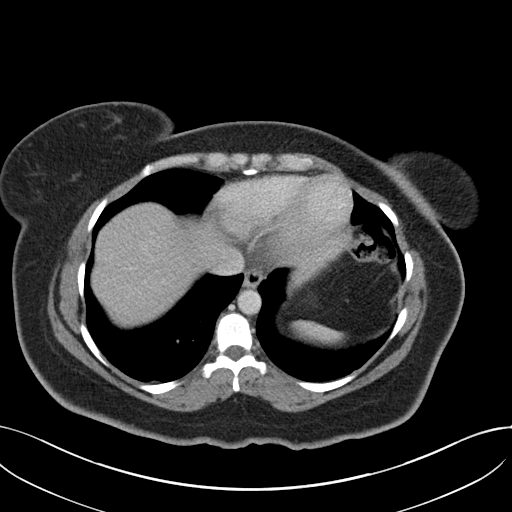

[Series 4: lung bases · axial · 0.79mm/px · z∈[-154,-138]mm · 2 of 54 slices shown]
[im 8/54  bone]
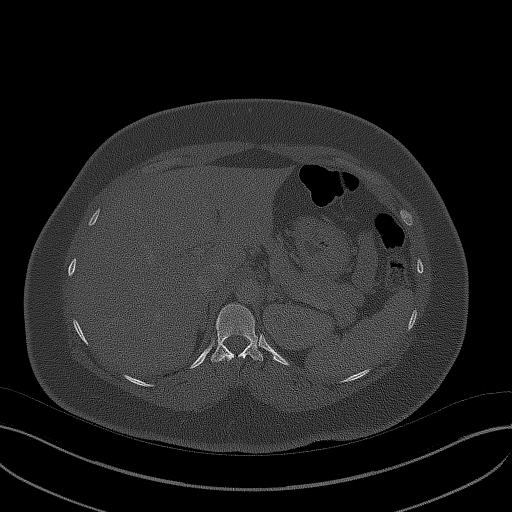
[im 16/54  bone]
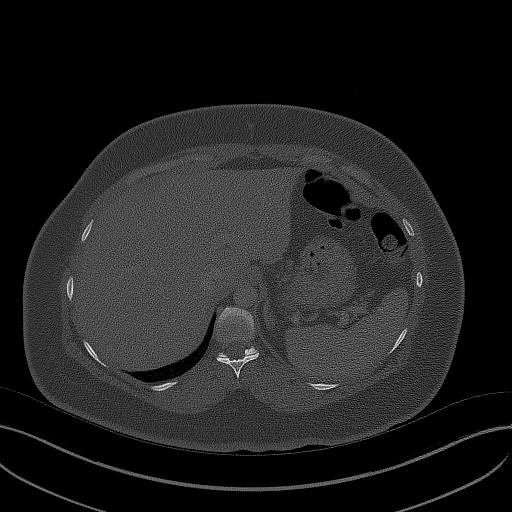

[Series 5: coronal st · coronal · 0.80mm/px · 3 of 150 slices shown]
[im 50/150  soft-tissue]
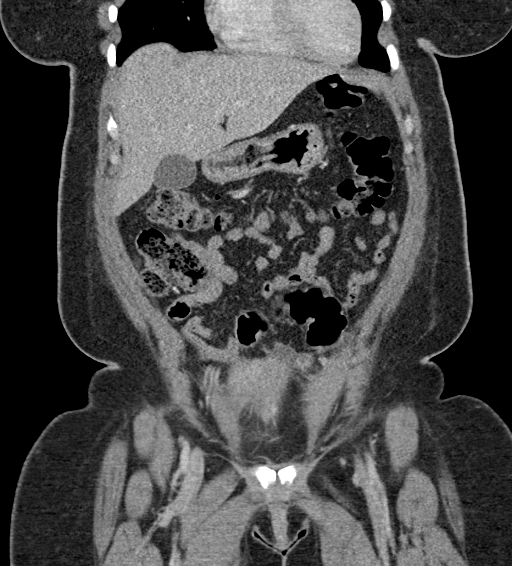
[im 67/150  soft-tissue]
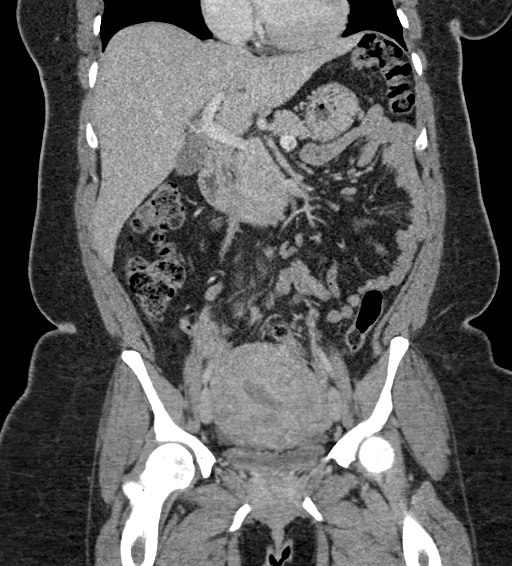
[im 83/150  soft-tissue]
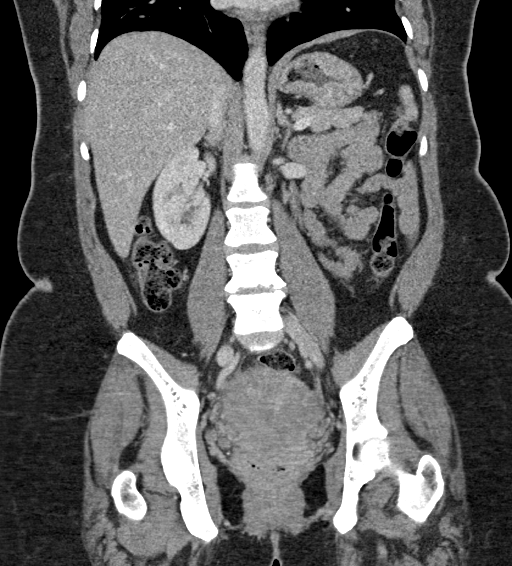

[16 of 46 positions shown; findings below may reference images not displayed]

FINDINGS: Lower chest: The visualized lung bases are clear.

No intra-abdominal free air or free fluid.

Hepatobiliary: No focal liver abnormality is seen. No gallstones,
gallbladder wall thickening, or biliary dilatation.

Pancreas: Unremarkable. No pancreatic ductal dilatation or
surrounding inflammatory changes.

Spleen: Normal in size without focal abnormality.

Adrenals/Urinary Tract: The adrenal glands unremarkable. There is no
hydronephrosis on either side. There is symmetric enhancement and
excretion of contrast by both kidneys. The visualized ureters appear
unremarkable. The urinary bladder is collapsed.

Stomach/Bowel: There is no bowel obstruction or active inflammation.
The appendix is normal.

Vascular/Lymphatic: The abdominal aorta and IVC are unremarkable.
Faint nonocclusive peripheral low density in the splenic vein ([DATE]
and 71/5), likely artifactual and related to mixing. No portal
venous gas. There is no adenopathy.

Reproductive: Enlarged myomatous uterus. The endometrium is slightly
irregular, likely related to submucosal fibroid. Several small low
attenuating fibroid, likely degenerative. No adnexal masses.

Other: Anterior pelvic wall C-section scar.

Musculoskeletal: Degenerative changes at L4-5. No acute osseous
pathology.
IMPRESSION: 1. No acute intra-abdominal or pelvic pathology. No bowel
obstruction. Normal appendix.
2. Enlarged myomatous uterus.

## 2021-09-13 ENCOUNTER — Emergency Department (HOSPITAL_COMMUNITY)
Admission: EM | Admit: 2021-09-13 | Discharge: 2021-09-13 | Disposition: A | Discharge status: Home / Self Care | Payer: 59 | Attending: Emergency Medicine | Admitting: Emergency Medicine

## 2021-09-13 ENCOUNTER — Emergency Department (HOSPITAL_COMMUNITY): Payer: 59

## 2021-09-13 ENCOUNTER — Encounter (HOSPITAL_COMMUNITY): Payer: Self-pay

## 2021-09-13 DIAGNOSIS — R110 Nausea: Secondary | ICD-10-CM | POA: Diagnosis not present

## 2021-09-13 DIAGNOSIS — R519 Headache, unspecified: Secondary | ICD-10-CM | POA: Insufficient documentation

## 2021-09-13 DIAGNOSIS — H538 Other visual disturbances: Secondary | ICD-10-CM | POA: Insufficient documentation

## 2021-09-13 DIAGNOSIS — R9431 Abnormal electrocardiogram [ECG] [EKG]: Secondary | ICD-10-CM | POA: Diagnosis not present

## 2021-09-13 HISTORY — DX: Migraine, unspecified, not intractable, without status migrainosus: G43.909

## 2021-09-13 LAB — URINALYSIS, ROUTINE W REFLEX MICROSCOPIC
Bilirubin Urine: NEGATIVE
Glucose, UA: NEGATIVE mg/dL
Hgb urine dipstick: NEGATIVE
Ketones, ur: NEGATIVE mg/dL
Leukocytes,Ua: NEGATIVE
Nitrite: NEGATIVE
Protein, ur: NEGATIVE mg/dL
Specific Gravity, Urine: 1.016 (ref 1.005–1.030)
pH: 6 (ref 5.0–8.0)

## 2021-09-13 LAB — CBC WITH DIFFERENTIAL/PLATELET
Abs Immature Granulocytes: 0.02 10*3/uL (ref 0.00–0.07)
Basophils Absolute: 0.1 10*3/uL (ref 0.0–0.1)
Basophils Relative: 1 %
Eosinophils Absolute: 0.1 10*3/uL (ref 0.0–0.5)
Eosinophils Relative: 1 %
HCT: 34.9 % — ABNORMAL LOW (ref 36.0–46.0)
Hemoglobin: 10.2 g/dL — ABNORMAL LOW (ref 12.0–15.0)
Immature Granulocytes: 0 %
Lymphocytes Relative: 43 %
Lymphs Abs: 2.1 10*3/uL (ref 0.7–4.0)
MCH: 21.9 pg — ABNORMAL LOW (ref 26.0–34.0)
MCHC: 29.2 g/dL — ABNORMAL LOW (ref 30.0–36.0)
MCV: 74.9 fL — ABNORMAL LOW (ref 80.0–100.0)
Monocytes Absolute: 0.3 10*3/uL (ref 0.1–1.0)
Monocytes Relative: 7 %
Neutro Abs: 2.3 10*3/uL (ref 1.7–7.7)
Neutrophils Relative %: 48 %
Platelets: 175 10*3/uL (ref 150–400)
RBC: 4.66 MIL/uL (ref 3.87–5.11)
RDW: 19.6 % — ABNORMAL HIGH (ref 11.5–15.5)
WBC: 4.9 10*3/uL (ref 4.0–10.5)
nRBC: 0 % (ref 0.0–0.2)

## 2021-09-13 LAB — BASIC METABOLIC PANEL
Anion gap: 5 (ref 5–15)
BUN: 7 mg/dL (ref 6–20)
CO2: 22 mmol/L (ref 22–32)
Calcium: 9.2 mg/dL (ref 8.9–10.3)
Chloride: 113 mmol/L — ABNORMAL HIGH (ref 98–111)
Creatinine, Ser: 0.67 mg/dL (ref 0.44–1.00)
GFR, Estimated: >60 mL/min (ref >60)
Glucose, Bld: 115 mg/dL — ABNORMAL HIGH (ref 70–99)
Potassium: 4.4 mmol/L (ref 3.5–5.1)
Sodium: 140 mmol/L (ref 135–145)

## 2021-09-13 LAB — I-STAT BETA HCG BLOOD, ED (MC, WL, AP ONLY): I-stat hCG, quantitative: <5 m[IU]/mL (ref <5)

## 2021-09-13 LAB — MAGNESIUM: Magnesium: 2 mg/dL (ref 1.7–2.4)

## 2021-09-13 MED ORDER — LACTATED RINGERS IV BOLUS
1000.0000 mL | Freq: Once | INTRAVENOUS | Status: AC
  Administered 2021-09-13: 1000 mL via INTRAVENOUS

## 2021-09-13 MED ORDER — METOCLOPRAMIDE HCL 5 MG/ML IJ SOLN
10.0000 mg | Freq: Once | INTRAMUSCULAR | Status: AC
  Administered 2021-09-13: 10 mg via INTRAVENOUS
  Filled 2021-09-13: qty 2

## 2021-09-13 MED ORDER — DIPHENHYDRAMINE HCL 50 MG/ML IJ SOLN
12.5000 mg | Freq: Once | INTRAMUSCULAR | Status: AC
  Administered 2021-09-13: 12.5 mg via INTRAVENOUS
  Filled 2021-09-13: qty 1

## 2021-09-13 MED ORDER — KETOROLAC TROMETHAMINE 15 MG/ML IJ SOLN
15.0000 mg | Freq: Once | INTRAMUSCULAR | Status: AC
  Administered 2021-09-13: 15 mg via INTRAVENOUS
  Filled 2021-09-13: qty 1

## 2021-09-13 NOTE — ED Triage Notes (Signed)
Pt states she has had migraines on and off for one year. Pt states that yesterday she had sudden onset of migraine with blurry vision and dizziness. No unilateral weakness noted during triage. NIH 0, VAN neg

## 2021-09-13 NOTE — ED Provider Notes (Signed)
[facility] DEPT Provider Note   CSN: [id number] Arrival date & time: 09/13/21  1015     History  Chief Complaint  Patient presents with   Migraine    Eileen Patel is a 41 y.o. female.   Migraine Associated symptoms include headaches.  Patient resents for headache.  Medical history includes anemia, migraine headaches, fibroids.  She began having intermittent headaches last year.  She states that she will experience a headache approximately every 2 weeks.  She was seen by her primary care doctor.  She typically treats her headaches with ibuprofen and Tylenol.  Patient had onset of headache yesterday.  She denies any recent traumas.  Headache is generalized.  It is severe at times.  It is throbbing in nature.  She denies photophobia or phonophobia.  She denies associated nausea but has had decreased p.o. intake due to the pain.  She has taken Tylenol yesterday.  She has not taken anything today.  She endorses some blurry vision that is intermittent.  She denies any currently.  She denies any other neurologic symptoms.    Patient had a friend who passed away from a brain tumor and she is worried about this.     Home Medications Prior to Admission medications   Medication Sig Start Date End Date Taking? Authorizing Provider  docusate sodium (COLACE) 100 MG capsule Take 1 capsule (100 mg total) by mouth 2 (two) times daily. 08/07/19   Sherlyn Hay, DO  ferrous sulfate 325 (65 FE) MG tablet Take 325 mg by mouth daily with breakfast.    [provider]  Prenatal Vit-Fe Fumarate-FA (PRENATAL MULTIVITAMIN) TABS tablet Take 1 tablet by mouth every other day. 08/06/19   Sherlyn Hay, DO  promethazine (PHENERGAN) 25 MG tablet Take 1 tablet (25 mg total) by mouth every 8 (eight) hours as needed for nausea or vomiting. 08/30/19   Palumbo, April, MD      Allergies    Patient has no known allergies.    Review of Systems   Review of  Systems  Constitutional:  Positive for appetite change.  Eyes:  Positive for visual disturbance.  Neurological:  Positive for headaches.  All other systems reviewed and are negative.   Physical Exam Updated Vital Signs BP (!) 162/100   Pulse 63   Temp 98.4 F (36.9 C) (Oral)   Resp 16   Ht '5\' 4"'$  (1.626 m)   Wt 100.2 kg   SpO2 100%   BMI 37.93 kg/m  Physical Exam Vitals and nursing note reviewed.  Constitutional:      General: She is not in acute distress.    Appearance: Normal appearance. She is well-developed. She is not ill-appearing, toxic-appearing or diaphoretic.  HENT:     Head: Normocephalic and atraumatic.     Right Ear: External ear normal.     Left Ear: External ear normal.     Nose: Nose normal.     Mouth/Throat:     Mouth: Mucous membranes are moist.  Eyes:     Extraocular Movements: Extraocular movements intact.     Conjunctiva/sclera: Conjunctivae normal.  Cardiovascular:     Rate and Rhythm: Normal rate and regular rhythm.  Pulmonary:     Effort: Pulmonary effort is normal. No respiratory distress.  Abdominal:     General: There is no distension.     Palpations: Abdomen is soft.     Tenderness: There is no abdominal tenderness.  Musculoskeletal:  General: No swelling. Normal range of motion.     Cervical back: Normal range of motion and neck supple. No rigidity.     Right lower leg: No edema.     Left lower leg: No edema.  Skin:    General: Skin is warm and dry.     Coloration: Skin is not jaundiced or pale.  Neurological:     General: No focal deficit present.     Mental Status: She is alert and oriented to person, place, and time.     Cranial Nerves: Cranial nerves 2-12 are intact. No cranial nerve deficit, dysarthria or facial asymmetry.     Sensory: Sensation is intact. No sensory deficit.     Motor: No weakness, abnormal muscle tone or pronator drift.     Coordination: Coordination is intact. Romberg sign negative. Coordination normal.  Finger-Nose-Finger Test normal.     Gait: Gait is intact. Gait normal.  Psychiatric:        Mood and Affect: Mood normal.        Behavior: Behavior normal.     ED Results / Procedures / Treatments   Labs (all labs ordered are listed, but only abnormal results are displayed) Labs Reviewed  CBC WITH DIFFERENTIAL/PLATELET - Abnormal; Notable for the following components:      Result Value   Hemoglobin 10.2 (*)    HCT 34.9 (*)    MCV 74.9 (*)    MCH 21.9 (*)    MCHC 29.2 (*)    RDW 19.6 (*)    All other components within normal limits  BASIC METABOLIC PANEL - Abnormal; Notable for the following components:   Chloride 113 (*)    Glucose, Bld 115 (*)    All other components within normal limits  URINALYSIS, ROUTINE W REFLEX MICROSCOPIC  MAGNESIUM  I-STAT BETA HCG BLOOD, ED ([facility], [facility], [facility] ONLY)    EKG None  Radiology CT HEAD WO CONTRAST  Result Date: 09/13/2021 CLINICAL DATA:  Bandage last time you can drop headache. Chronic, new features. EXAM: CT HEAD WITHOUT CONTRAST TECHNIQUE: Contiguous axial images were obtained from the base of the skull through the vertex without intravenous contrast. RADIATION DOSE REDUCTION: This exam was performed according to the departmental dose-optimization program which includes automated exposure control, adjustment of the mA and/or kV according to patient size and/or use of iterative reconstruction technique. COMPARISON:  None Available. FINDINGS: Brain: No evidence of acute infarction, hemorrhage, hydrocephalus, extra-axial collection or mass lesion/mass effect. Vascular: No hyperdense vessel or unexpected calcification. Skull: Normal. Negative for fracture or focal lesion. Sinuses/Orbits: No acute finding. Other: None. IMPRESSION: No acute intracranial abnormality. Electronically Signed   By: Keane Police D.O.   On: 09/13/2021 17:12    Procedures Procedures    Medications Ordered in ED Medications  ketorolac (TORADOL) 15 MG/ML injection 15 mg (15  mg Intravenous Given 09/13/21 1716)  metoCLOPramide (REGLAN) injection 10 mg (10 mg Intravenous Given 09/13/21 1716)  diphenhydrAMINE (BENADRYL) injection 12.5 mg (12.5 mg Intravenous Given 09/13/21 1714)  lactated ringers bolus 1,000 mL (0 mLs Intravenous Stopped 09/13/21 1830)    ED Course/ Medical Decision Making/ A&P                           Medical Decision Making Amount and/or Complexity of Data Reviewed Labs: ordered. Radiology: ordered.  Risk Prescription drug management.   This patient presents to the ED for concern of headache, this involves an extensive number of  treatment options, and is a complaint that carries with it a high risk of complications and morbidity.  The differential diagnosis includes migraine headache, tension headache, ICH, neoplasm, IIH, URI   Co morbidities that complicate the patient evaluation  anemia, migraine headaches, fibroids   Additional history obtained:  Additional history obtained from N/A External records from outside source obtained and reviewed including EMR   Lab Tests:  I Ordered, and personally interpreted labs.  The pertinent results include: Baseline anemia, no leukocytosis, normal electrolytes   Imaging Studies ordered:  I ordered imaging studies including CT head I independently visualized and interpreted imaging which showed no acute findings I agree with the radiologist interpretation   Cardiac Monitoring: / EKG:  The patient was maintained on a cardiac monitor.  I personally viewed and interpreted the cardiac monitored which showed an underlying rhythm of: NS rhythm   Problem List / ED Course / Critical interventions / Medication management  Patient is a pleasant 41 year old female presenting for headache.  Headache has been persistent since yesterday.  It is generalized in location.  It is throbbing in nature.  She has had intermittent blurry vision and had an episode of chest tightness yesterday.  She denies any  other associated symptoms.  Patient is well-appearing on exam and has no focal neurologic deficits.  She has had recent decreased p.o. intake due to the pain.  IV fluids and headache cocktail were ordered.  Patient to undergo lab work and CT imaging of head.  On reassessment, patient reported complete resolution of symptoms.  CT was negative for acute findings.  Laboratory work-up was reassuring.  On bedside ocular ultrasound, patient has no papilledema and no increased diameter of optic nerve.  While in the ED, patient did have elevated blood pressures.  She was informed of this and advised to follow-up with PCP for recheck of blood pressures and possible initiation of antihypertensive medications.  She is stable for discharge at this time. I ordered medication including IV fluids for decreased p.o. intake; Toradol, Reglan, and Benadryl for symptomatic relief of headache Reevaluation of the patient after these medicines showed that the patient resolved I have reviewed the patients home medicines and have made adjustments as needed   Social Determinants of Health:  Has PCP         Final Clinical Impression(s) / ED Diagnoses Final diagnoses:  Bad headache    Rx / DC Orders ED Discharge Orders     None         Godfrey Pick, MD 09/13/21 1900

## 2021-09-13 NOTE — Discharge Instructions (Signed)
Your blood pressures today were elevated.  This is something you should continue to check at home and follow-up with your primary care doctor about.  If you do have sustained high blood pressures, you would benefit from a medication to treat hypertension.  Return to the emergency department at any time for any new or worsening symptoms of concern.

## 2021-12-12 DIAGNOSIS — R635 Abnormal weight gain: Secondary | ICD-10-CM | POA: Diagnosis not present

## 2021-12-12 DIAGNOSIS — Z1231 Encounter for screening mammogram for malignant neoplasm of breast: Secondary | ICD-10-CM | POA: Diagnosis not present

## 2022-02-15 DIAGNOSIS — N920 Excessive and frequent menstruation with regular cycle: Secondary | ICD-10-CM | POA: Diagnosis not present

## 2022-02-15 DIAGNOSIS — E119 Type 2 diabetes mellitus without complications: Secondary | ICD-10-CM | POA: Diagnosis not present

## 2022-02-15 DIAGNOSIS — Z1389 Encounter for screening for other disorder: Secondary | ICD-10-CM | POA: Diagnosis not present

## 2022-02-15 DIAGNOSIS — E039 Hypothyroidism, unspecified: Secondary | ICD-10-CM | POA: Diagnosis not present

## 2022-02-15 DIAGNOSIS — Z01411 Encounter for gynecological examination (general) (routine) with abnormal findings: Secondary | ICD-10-CM | POA: Diagnosis not present

## 2022-02-15 DIAGNOSIS — Z130 Encounter for screening for diseases of the blood and blood-forming organs and certain disorders involving the immune mechanism: Secondary | ICD-10-CM | POA: Diagnosis not present

## 2022-02-22 DIAGNOSIS — Z833 Family history of diabetes mellitus: Secondary | ICD-10-CM | POA: Diagnosis not present

## 2022-02-22 DIAGNOSIS — E119 Type 2 diabetes mellitus without complications: Secondary | ICD-10-CM | POA: Diagnosis not present

## 2022-02-22 DIAGNOSIS — Z5948 Other specified lack of adequate food: Secondary | ICD-10-CM | POA: Diagnosis not present

## 2022-02-22 DIAGNOSIS — Z7984 Long term (current) use of oral hypoglycemic drugs: Secondary | ICD-10-CM | POA: Diagnosis not present

## 2022-02-22 DIAGNOSIS — Z6838 Body mass index (BMI) 38.0-38.9, adult: Secondary | ICD-10-CM | POA: Diagnosis not present

## 2022-02-22 DIAGNOSIS — R030 Elevated blood-pressure reading, without diagnosis of hypertension: Secondary | ICD-10-CM | POA: Diagnosis not present

## 2022-02-22 DIAGNOSIS — Z5986 Financial insecurity: Secondary | ICD-10-CM | POA: Diagnosis not present

## 2022-12-04 ENCOUNTER — Ambulatory Visit: Payer: 59 | Attending: Family Medicine | Admitting: Family Medicine

## 2022-12-04 ENCOUNTER — Other Ambulatory Visit: Payer: Self-pay

## 2022-12-04 ENCOUNTER — Encounter: Payer: Self-pay | Admitting: Family Medicine

## 2022-12-04 VITALS — BP 142/91 | HR 88 | Ht 64.0 in | Wt 218.0 lb

## 2022-12-04 DIAGNOSIS — E669 Obesity, unspecified: Secondary | ICD-10-CM | POA: Insufficient documentation

## 2022-12-04 DIAGNOSIS — Z13228 Encounter for screening for other metabolic disorders: Secondary | ICD-10-CM

## 2022-12-04 DIAGNOSIS — Z86018 Personal history of other benign neoplasm: Secondary | ICD-10-CM | POA: Insufficient documentation

## 2022-12-04 DIAGNOSIS — Z6837 Body mass index (BMI) 37.0-37.9, adult: Secondary | ICD-10-CM | POA: Insufficient documentation

## 2022-12-04 DIAGNOSIS — Z7182 Exercise counseling: Secondary | ICD-10-CM | POA: Insufficient documentation

## 2022-12-04 DIAGNOSIS — R9431 Abnormal electrocardiogram [ECG] [EKG]: Secondary | ICD-10-CM | POA: Insufficient documentation

## 2022-12-04 DIAGNOSIS — R7303 Prediabetes: Secondary | ICD-10-CM | POA: Insufficient documentation

## 2022-12-04 DIAGNOSIS — D500 Iron deficiency anemia secondary to blood loss (chronic): Secondary | ICD-10-CM

## 2022-12-04 DIAGNOSIS — R0789 Other chest pain: Secondary | ICD-10-CM | POA: Diagnosis not present

## 2022-12-04 DIAGNOSIS — D649 Anemia, unspecified: Secondary | ICD-10-CM | POA: Insufficient documentation

## 2022-12-04 DIAGNOSIS — Z7984 Long term (current) use of oral hypoglycemic drugs: Secondary | ICD-10-CM | POA: Insufficient documentation

## 2022-12-04 DIAGNOSIS — R030 Elevated blood-pressure reading, without diagnosis of hypertension: Secondary | ICD-10-CM | POA: Insufficient documentation

## 2022-12-04 LAB — POCT GLYCOSYLATED HEMOGLOBIN (HGB A1C): HbA1c, POC (prediabetic range): 5.8 % (ref 5.7–6.4)

## 2022-12-04 MED ORDER — FERROUS SULFATE 325 (65 FE) MG PO TABS: 325.0000 mg | ORAL_TABLET | Freq: Two times a day (BID) | ORAL | 6 refills | Status: AC

## 2022-12-04 NOTE — Patient Instructions (Signed)
Calorie Counting for Weight Loss Calories are units of energy. Your body needs a certain number of calories from food to keep going throughout the day. When you eat or drink more calories than your body needs, your body stores the extra calories mostly as fat. When you eat or drink fewer calories than your body needs, your body burns fat to get the energy it needs. Calorie counting means keeping track of how many calories you eat and drink each day. Calorie counting can be helpful if you need to lose weight. If you eat fewer calories than your body needs, you should lose weight. Ask your health care provider what a healthy weight is for you. For calorie counting to work, you will need to eat the right number of calories each day to lose a healthy amount of weight per week. A dietitian can help you figure out how many calories you need in a day and will suggest ways to reach your calorie goal. A healthy amount of weight to lose each week is usually 1-2 lb (0.5-0.9 kg). This usually means that your daily calorie intake should be reduced by 500-750 calories. Eating 1,200-1,500 calories a day can help most women lose weight. Eating 1,500-1,800 calories a day can help most men lose weight. What do I need to know about calorie counting? Work with your health care provider or dietitian to determine how many calories you should get each day. To meet your daily calorie goal, you will need to: Find out how many calories are in each food that you would like to eat. Try to do this before you eat. Decide how much of the food you plan to eat. Keep a food log. Do this by writing down what you ate and how many calories it had. To successfully lose weight, it is important to balance calorie counting with a healthy lifestyle that includes regular activity. Where do I find calorie information?  The number of calories in a food can be found on a Nutrition Facts label. If a food does not have a Nutrition Facts label, try  to look up the calories online or ask your dietitian for help. Remember that calories are listed per serving. If you choose to have more than one serving of a food, you will have to multiply the calories per serving by the number of servings you plan to eat. For example, the label on a package of bread might say that a serving size is 1 slice and that there are 90 calories in a serving. If you eat 1 slice, you will have eaten 90 calories. If you eat 2 slices, you will have eaten 180 calories. How do I keep a food log? After each time that you eat, record the following in your food log as soon as possible: What you ate. Be sure to include toppings, sauces, and other extras on the food. How much you ate. This can be measured in cups, ounces, or number of items. How many calories were in each food and drink. The total number of calories in the food you ate. Keep your food log near you, such as in a pocket-sized notebook or on an app or website on your mobile phone. Some programs will calculate calories for you and show you how many calories you have left to meet your daily goal. What are some portion-control tips? Know how many calories are in a serving. This will help you know how many servings you can have of a certain   food. Use a measuring cup to measure serving sizes. You could also try weighing out portions on a kitchen scale. With time, you will be able to estimate serving sizes for some foods. Take time to put servings of different foods on your favorite plates or in your favorite bowls and cups so you know what a serving looks like. Try not to eat straight from a food's packaging, such as from a bag or box. Eating straight from the package makes it hard to see how much you are eating and can lead to overeating. Put the amount you would like to eat in a cup or on a plate to make sure you are eating the right portion. Use smaller plates, glasses, and bowls for smaller portions and to prevent  overeating. Try not to multitask. For example, avoid watching TV or using your computer while eating. If it is time to eat, sit down at a table and enjoy your food. This will help you recognize when you are full. It will also help you be more mindful of what and how much you are eating. What are tips for following this plan? Reading food labels Check the calorie count compared with the serving size. The serving size may be smaller than what you are used to eating. Check the source of the calories. Try to choose foods that are high in protein, fiber, and vitamins, and low in saturated fat, trans fat, and sodium. Shopping Read nutrition labels while you shop. This will help you make healthy decisions about which foods to buy. Pay attention to nutrition labels for low-fat or fat-free foods. These foods sometimes have the same number of calories or more calories than the full-fat versions. They also often have added sugar, starch, or salt to make up for flavor that was removed with the fat. Make a grocery list of lower-calorie foods and stick to it. Cooking Try to cook your favorite foods in a healthier way. For example, try baking instead of frying. Use low-fat dairy products. Meal planning Use more fruits and vegetables. One-half of your plate should be fruits and vegetables. Include lean proteins, such as chicken, turkey, and fish. Lifestyle Each week, aim to do one of the following: 150 minutes of moderate exercise, such as walking. 75 minutes of vigorous exercise, such as running. General information Know how many calories are in the foods you eat most often. This will help you calculate calorie counts faster. Find a way of tracking calories that works for you. Get creative. Try different apps or programs if writing down calories does not work for you. What foods should I eat?  Eat nutritious foods. It is better to have a nutritious, high-calorie food, such as an avocado, than a food with  few nutrients, such as a bag of potato chips. Use your calories on foods and drinks that will fill you up and will not leave you hungry soon after eating. Examples of foods that fill you up are nuts and nut butters, vegetables, lean proteins, and high-fiber foods such as whole grains. High-fiber foods are foods with more than 5 g of fiber per serving. Pay attention to calories in drinks. Low-calorie drinks include water and unsweetened drinks. The items listed above may not be a complete list of foods and beverages you can eat. Contact a dietitian for more information. What foods should I limit? Limit foods or drinks that are not good sources of vitamins, minerals, or protein or that are high in unhealthy fats. These   include: Candy. Other sweets. Sodas, specialty coffee drinks, alcohol, and juice. The items listed above may not be a complete list of foods and beverages you should avoid. Contact a dietitian for more information. How do I count calories when eating out? Pay attention to portions. Often, portions are much larger when eating out. Try these tips to keep portions smaller: Consider sharing a meal instead of getting your own. If you get your own meal, eat only half of it. Before you start eating, ask for a container and put half of your meal into it. When available, consider ordering smaller portions from the menu instead of full portions. Pay attention to your food and drink choices. Knowing the way food is cooked and what is included with the meal can help you eat fewer calories. If calories are listed on the menu, choose the lower-calorie options. Choose dishes that include vegetables, fruits, whole grains, low-fat dairy products, and lean proteins. Choose items that are boiled, broiled, grilled, or steamed. Avoid items that are buttered, battered, fried, or served with cream sauce. Items labeled as crispy are usually fried, unless stated otherwise. Choose water, low-fat milk,  unsweetened iced tea, or other drinks without added sugar. If you want an alcoholic beverage, choose a lower-calorie option, such as a glass of wine or light beer. Ask for dressings, sauces, and syrups on the side. These are usually high in calories, so you should limit the amount you eat. If you want a salad, choose a garden salad and ask for grilled meats. Avoid extra toppings such as bacon, cheese, or fried items. Ask for the dressing on the side, or ask for olive oil and vinegar or lemon to use as dressing. Estimate how many servings of a food you are given. Knowing serving sizes will help you be aware of how much food you are eating at restaurants. Where to find more information Centers for Disease Control and Prevention: www.cdc.gov [place] Department of Agriculture: myplate.gov Summary Calorie counting means keeping track of how many calories you eat and drink each day. If you eat fewer calories than your body needs, you should lose weight. A healthy amount of weight to lose per week is usually 1-2 lb (0.5-0.9 kg). This usually means reducing your daily calorie intake by 500-750 calories. The number of calories in a food can be found on a Nutrition Facts label. If a food does not have a Nutrition Facts label, try to look up the calories online or ask your dietitian for help. Use smaller plates, glasses, and bowls for smaller portions and to prevent overeating. Use your calories on foods and drinks that will fill you up and not leave you hungry shortly after a meal. This information is not intended to replace advice given to you by your health care provider. Make sure you discuss any questions you have with your health care provider. Document Revised: 01/30/2019 Document Reviewed: 01/30/2019 [employer] Patient Education  2023 [employer]  

## 2022-12-04 NOTE — Progress Notes (Signed)
Subjective:  Patient ID: Eileen Patel, female    DOB: 1981-01-01  Age: 42 y.o. MRN: [id number]  CC: New Patient (Initial Visit) (Discuss anemia/Discuss weight/Discuss getting pregnant)   HPI [name] is a 42 y.o. year old female with a history of obesity, anemia secondary to uterine fibroids, prediabetes.  Interval History: Discussed the use of AI scribe software for clinical note transcription with the patient, who gave verbal consent to proceed.  She presents concerned about weight loss and anemia. She has been trying to lose weight in order to become pregnant, as advised by her OBGYN. She has been taking metformin, prescribed by her OBGYN, and has been trying to reduce her food intake. She has also attempted to exercise, specifically Zumba, but has been experiencing chest pain during these sessions. The chest pain also occurs when she is at rest. She has been trying to improve her diet, including intermittent fasting and reducing her intake of rice and starches, but has not seen any improvement in her weight.  She is also anemic due to her uterine fibroids and is currently on ferrous sulfate prescribed by OB/GYN.  Last hemoglobin in the chart is 10.2 from 09/2021 however she stated she had labs this year at her OB/GYN appointment.       Past Medical History:  Diagnosis Date   Anemia    Migraine    Vaginal fibroids    pt reports hx of ABD pain with  fibroids    Past Surgical History:  Procedure Laterality Date   CESAREAN SECTION  2008   CHROMOPERTUBATION N/A 08/04/2019   Procedure: CHROMOPERTUBATION;  Surgeon: Edwinna Areola, DO;  Location: [facility] OR;  Service: Gynecology;  Laterality: N/A;  possible   DIAGNOSTIC LAPAROSCOPY     ectopic preg   LYSIS OF ADHESION  08/04/2019   Procedure: EXTENSIVE LYSIS OF ADHESIONS;  Surgeon: Edwinna Areola, DO;  Location: [facility] OR;  Service: Gynecology;;   MYOMECTOMY N/A 08/04/2019   Procedure: ABDOMINAL MYOMECTOMY;  Surgeon:  Edwinna Areola, DO;  Location: [facility] OR;  Service: Gynecology;  Laterality: N/A;   ROBOT ASSISTED MYOMECTOMY N/A 10/10/2012   Procedure: ROBOTIC ASSISTED MYOMECTOMY WITH CHRMOPER TUBATION;  Surgeon: Esmeralda Arthur, MD;  Location: [facility];  Service: Gynecology;  Laterality: N/A;   WISDOM TOOTH EXTRACTION      No family history on file.  Social History   Socioeconomic History   Marital status: Single    Spouse name: Not on file   Number of children: Not on file   Years of education: Not on file   Highest education level: Not on file  Occupational History   Not on file  Tobacco Use   Smoking status: Never   Smokeless tobacco: Never  Vaping Use   Vaping status: Never Used  Substance and Sexual Activity   Alcohol use: No   Drug use: No   Sexual activity: Yes    Birth control/protection: None  Other Topics Concern   Not on file  Social History Narrative   Not on file   Social Determinants of Health   Financial Resource Strain: Low Risk  (12/04/2022)   Overall Financial Resource Strain (CARDIA)    Difficulty of Paying Living Expenses: Not hard at all  Food Insecurity: No Food Insecurity (12/04/2022)   Hunger Vital Sign    Worried About Running Out of Food in the Last Year: Never true    Ran Out of Food in the Last Year: Never true  Transportation Needs: No Transportation Needs (12/04/2022)   PRAPARE - Administrator, Civil Service (Medical): No    Lack of Transportation (Non-Medical): No  Physical Activity: Insufficiently Active (12/04/2022)   Exercise Vital Sign    Days of Exercise per Week: 1 day    Minutes of Exercise per Session: 10 min  Stress: No Stress Concern Present (12/04/2022)   [employer] of Occupational Health - Occupational Stress Questionnaire    Feeling of Stress : Not at all  Social Connections: Unknown (12/04/2022)   Social Connection and Isolation Panel [NHANES]    Frequency of Communication with Friends and Family: More than three  times a week    Frequency of Social Gatherings with Friends and Family: More than three times a week    Attends Religious Services: More than 4 times per year    Active Member of [employer] or Organizations: Yes    Attends Engineer, structural: More than 4 times per year    Marital Status: Not on file    No Known Allergies  Outpatient Medications Prior to Visit  Medication Sig Dispense Refill   metFORMIN (GLUCOPHAGE) 500 MG tablet Take 1 tablet by mouth 2 (two) times daily.     ferrous sulfate 325 (65 FE) MG tablet Take 325 mg by mouth daily with breakfast.     docusate sodium (COLACE) 100 MG capsule Take 1 capsule (100 mg total) by mouth 2 (two) times daily. (Patient not taking: Reported on 12/04/2022) 20 capsule 0   Prenatal Vit-Fe Fumarate-FA (PRENATAL MULTIVITAMIN) TABS tablet Take 1 tablet by mouth every other day. (Patient not taking: Reported on 12/04/2022) 40 tablet 1   promethazine (PHENERGAN) 25 MG tablet Take 1 tablet (25 mg total) by mouth every 8 (eight) hours as needed for nausea or vomiting. (Patient not taking: Reported on 12/04/2022) 12 tablet 0   No facility-administered medications prior to visit.     ROS Review of Systems  Constitutional:  Negative for activity change and appetite change.  HENT:  Negative for sinus pressure and sore throat.   Respiratory:  Negative for chest tightness, shortness of breath and wheezing.   Cardiovascular:  Negative for chest pain and palpitations.  Gastrointestinal:  Negative for abdominal distention, abdominal pain and constipation.  Genitourinary: Negative.   Musculoskeletal: Negative.   Psychiatric/Behavioral:  Negative for behavioral problems and dysphoric mood.     Objective:  BP (!) 142/91   Pulse 88   Ht 5\' 4"  (1.626 m)   Wt 218 lb (98.9 kg)   LMP 11/17/2022   SpO2 99%   BMI 37.42 kg/m      12/04/2022   11:00 AM 12/04/2022    9:54 AM 09/13/2021    5:30 PM  BP/Weight  Systolic BP 142 146 162  Diastolic BP 91 94  100  Wt. (Lbs)  218   BMI  37.42 kg/m2       Physical Exam Constitutional:      Appearance: She is well-developed.  Cardiovascular:     Rate and Rhythm: Normal rate.     Heart sounds: Normal heart sounds. No murmur heard. Pulmonary:     Effort: Pulmonary effort is normal.     Breath sounds: Normal breath sounds. No wheezing or rales.  Chest:     Chest wall: No tenderness.  Abdominal:     General: Bowel sounds are normal. There is no distension.     Palpations: Abdomen is soft. There is no mass.  Tenderness: There is no abdominal tenderness.  Musculoskeletal:        General: Normal range of motion.     Right lower leg: No edema.     Left lower leg: No edema.  Neurological:     Mental Status: She is alert and oriented to person, place, and time.  Psychiatric:        Mood and Affect: Mood normal.        Latest Ref Rng & Units 09/13/2021   11:28 AM 08/29/2019    6:53 PM 08/04/2019   10:24 AM  CMP  Glucose 70 - 99 mg/dL 829  84  562   BUN 6 - 20 mg/dL 7  11  13    Creatinine 0.44 - 1.00 mg/dL 1.30  8.65  7.84   Sodium 135 - 145 mmol/L 140  138  139   Potassium 3.5 - 5.1 mmol/L 4.4  3.4  5.6   Chloride 98 - 111 mmol/L 113  106  106   CO2 22 - 32 mmol/L 22  22    Calcium 8.9 - 10.3 mg/dL 9.2  9.1    Total Protein 6.5 - 8.1 g/dL  8.3    Total Bilirubin 0.3 - 1.2 mg/dL  0.4    Alkaline Phos 38 - 126 U/L  62    AST 15 - 41 U/L  13    ALT 0 - 44 U/L  16      Lipid Panel  No results found for: "CHOL", "TRIG", "HDL", "CHOLHDL", "VLDL", "LDLCALC", "LDLDIRECT"  CBC    Component Value Date/Time   WBC 4.9 09/13/2021 1128   RBC 4.66 09/13/2021 1128   HGB 10.2 (L) 09/13/2021 1128   HCT 34.9 (L) 09/13/2021 1128   PLT 175 09/13/2021 1128   MCV 74.9 (L) 09/13/2021 1128   MCH 21.9 (L) 09/13/2021 1128   MCHC 29.2 (L) 09/13/2021 1128   RDW 19.6 (H) 09/13/2021 1128   LYMPHSABS 2.1 09/13/2021 1128   MONOABS 0.3 09/13/2021 1128   EOSABS 0.1 09/13/2021 1128   BASOSABS 0.1  09/13/2021 1128    Lab Results  Component Value Date   HGBA1C 5.8 12/04/2022    Assessment & Plan:      Obesity Patient is currently on Metformin prescribed by her OBGYN for weight loss. She has attempted intermittent fasting and dietary modifications with limited success. She has difficulty with exercise due to chest pain during exertion.  -She would be ideal for a GLP-1 RA however with no diagnosis of diabetes but this obesity and prediabetes obtaining approval for this to her insurance will be difficult. -Refer to medical weight management clinic for comprehensive weight loss support including nutritionist consultation. -Encourage gradual increase in exercise, starting with walking up to goal of 150 minutes/week  Prediabetes A1c of 5.8, indicating prediabetes. Patient is currently on Metformin. -Continue Metformin. -Educate on importance of diet and exercise in managing prediabetes.  Anemia Likely secondary to heavy menstrual bleeding due to fibroids. Last blood count check was last year with a hemoglobin of 10.2 -Order CBC to assess current hemoglobin levels. -Refill ferrous sulfate  Elevated blood pressure Blood pressure elevated during today's visit with no known diagnosis of hypertension -Advise on low sodium diet and weight loss. -Recheck blood pressure in 3 months. Consider antihypertensive medication if still elevated.   Atypical chest pain EKG reveals no ST segment changes but rather prolonged QT Advised on regards to exercise she needs to start small and build up gradually  Meds ordered this encounter  Medications   ferrous sulfate 325 (65 FE) MG tablet    Sig: Take 1 tablet (325 mg total) by mouth 2 (two) times daily with a meal.    Dispense:  60 tablet    Refill:  6    Follow-up: Return in about 3 months (around 03/04/2023) for Follow-up on weight.       Hoy Register, MD, FAAFP. [facility] [place],  [place] [phone]   12/04/2022, 11:11 AM

## 2022-12-05 LAB — CMP14+EGFR
ALT: 11 [IU]/L (ref 0–32)
AST: 10 [IU]/L (ref 0–40)
Albumin: 4 g/dL (ref 3.9–4.9)
Alkaline Phosphatase: 71 [IU]/L (ref 44–121)
BUN/Creatinine Ratio: 13 (ref 9–23)
BUN: 8 mg/dL (ref 6–24)
Bilirubin Total: <0.2 mg/dL (ref 0.0–1.2)
CO2: 19 mmol/L — ABNORMAL LOW (ref 20–29)
Calcium: 8.7 mg/dL (ref 8.7–10.2)
Chloride: 106 mmol/L (ref 96–106)
Creatinine, Ser: 0.61 mg/dL (ref 0.57–1.00)
Globulin, Total: 3.3 g/dL (ref 1.5–4.5)
Glucose: 88 mg/dL (ref 70–99)
Potassium: 4.4 mmol/L (ref 3.5–5.2)
Sodium: 139 mmol/L (ref 134–144)
Total Protein: 7.3 g/dL (ref 6.0–8.5)
eGFR: 115 mL/min/{1.73_m2} (ref >59)

## 2022-12-05 LAB — LP+NON-HDL CHOLESTEROL
Cholesterol, Total: 166 mg/dL (ref 100–199)
HDL: 42 mg/dL (ref >39)
LDL Chol Calc (NIH): 102 mg/dL — ABNORMAL HIGH (ref 0–99)
Total Non-HDL-Chol (LDL+VLDL): 124 mg/dL (ref 0–129)
Triglycerides: 125 mg/dL (ref 0–149)
VLDL Cholesterol Cal: 22 mg/dL (ref 5–40)

## 2022-12-05 LAB — CBC WITH DIFFERENTIAL/PLATELET
Basophils Absolute: 0.1 10*3/uL (ref 0.0–0.2)
Basos: 1 %
EOS (ABSOLUTE): 0.1 10*3/uL (ref 0.0–0.4)
Eos: 2 %
Hematocrit: 33.6 % — ABNORMAL LOW (ref 34.0–46.6)
Hemoglobin: 10 g/dL — ABNORMAL LOW (ref 11.1–15.9)
Immature Grans (Abs): 0 10*3/uL (ref 0.0–0.1)
Immature Granulocytes: 0 %
Lymphocytes Absolute: 2.4 10*3/uL (ref 0.7–3.1)
Lymphs: 36 %
MCH: 22 pg — ABNORMAL LOW (ref 26.6–33.0)
MCHC: 29.8 g/dL — ABNORMAL LOW (ref 31.5–35.7)
MCV: 74 fL — ABNORMAL LOW (ref 79–97)
Monocytes Absolute: 0.5 10*3/uL (ref 0.1–0.9)
Monocytes: 7 %
Neutrophils Absolute: 3.7 10*3/uL (ref 1.4–7.0)
Neutrophils: 54 %
Platelets: 181 10*3/uL (ref 150–450)
RBC: 4.55 x10E6/uL (ref 3.77–5.28)
RDW: 20.2 % — ABNORMAL HIGH (ref 11.7–15.4)
WBC: 6.8 10*3/uL (ref 3.4–10.8)

## 2023-03-05 ENCOUNTER — Ambulatory Visit: Payer: 59 | Admitting: Family Medicine

## 2023-10-01 DIAGNOSIS — R5383 Other fatigue: Secondary | ICD-10-CM | POA: Diagnosis not present

## 2023-10-01 DIAGNOSIS — R6889 Other general symptoms and signs: Secondary | ICD-10-CM | POA: Diagnosis not present

## 2023-10-01 DIAGNOSIS — Z1231 Encounter for screening mammogram for malignant neoplasm of breast: Secondary | ICD-10-CM | POA: Diagnosis not present

## 2023-10-01 DIAGNOSIS — Z01419 Encounter for gynecological examination (general) (routine) without abnormal findings: Secondary | ICD-10-CM | POA: Diagnosis not present

## 2023-10-01 DIAGNOSIS — Z130 Encounter for screening for diseases of the blood and blood-forming organs and certain disorders involving the immune mechanism: Secondary | ICD-10-CM | POA: Diagnosis not present

## 2023-10-01 DIAGNOSIS — E669 Obesity, unspecified: Secondary | ICD-10-CM | POA: Diagnosis not present

## 2023-10-01 DIAGNOSIS — I10 Essential (primary) hypertension: Secondary | ICD-10-CM | POA: Diagnosis not present

## 2023-10-01 DIAGNOSIS — Z1389 Encounter for screening for other disorder: Secondary | ICD-10-CM | POA: Diagnosis not present

## 2023-10-01 DIAGNOSIS — D500 Iron deficiency anemia secondary to blood loss (chronic): Secondary | ICD-10-CM | POA: Diagnosis not present

## 2023-10-01 DIAGNOSIS — Z01411 Encounter for gynecological examination (general) (routine) with abnormal findings: Secondary | ICD-10-CM | POA: Diagnosis not present

## 2023-10-01 DIAGNOSIS — N920 Excessive and frequent menstruation with regular cycle: Secondary | ICD-10-CM | POA: Diagnosis not present

## 2023-11-19 DIAGNOSIS — N921 Excessive and frequent menstruation with irregular cycle: Secondary | ICD-10-CM | POA: Diagnosis not present

## 2023-11-19 DIAGNOSIS — D219 Benign neoplasm of connective and other soft tissue, unspecified: Secondary | ICD-10-CM | POA: Diagnosis not present

## 2023-11-19 DIAGNOSIS — Z7689 Persons encountering health services in other specified circumstances: Secondary | ICD-10-CM | POA: Diagnosis not present

## 2024-02-05 ENCOUNTER — Encounter (HOSPITAL_COMMUNITY): Payer: Self-pay | Admitting: Obstetrics and Gynecology

## 2024-02-05 NOTE — Progress Notes (Signed)
 Spoke w/ via phone for pre-op interview--- [facility] Lab needs dos----  CBC, T&S and UPT per surgeon. Lab appt 02/06/24 at 1PM       Lab results------ COVID test -----patient states asymptomatic no test needed Arrive at -------0700 NPO after MN NO Solid Food.   Pre-Surgery Ensure or G2:  Med rec completed Medications to take morning of surgery -----NONE Diabetic medication -----  GLP1 agonist last dose: Ozempic 2mg  weekly. Last dose 01/15/24. GLP1 instructions:Hold any doses until after surgery.  Patient instructed no nail polish to be worn day of surgery Patient instructed to bring photo id and insurance card day of surgery Patient aware to have Driver (ride ) / caregiver    for 24 hours after surgery - [facility] Patient Special Instructions -----CHG shower night before surgery. Pre-Op special Instructions -----  Patient verbalized understanding of instructions that were given at this phone interview. Patient denies chest pain, sob, fever, cough at the interview.

## 2024-02-06 ENCOUNTER — Inpatient Hospital Stay (HOSPITAL_COMMUNITY): Admission: RE | Admit: 2024-02-06 | Discharge: 2024-02-06 | Attending: Obstetrics and Gynecology

## 2024-02-06 DIAGNOSIS — D252 Subserosal leiomyoma of uterus: Secondary | ICD-10-CM | POA: Insufficient documentation

## 2024-02-06 DIAGNOSIS — D251 Intramural leiomyoma of uterus: Secondary | ICD-10-CM | POA: Insufficient documentation

## 2024-02-06 DIAGNOSIS — Z01812 Encounter for preprocedural laboratory examination: Secondary | ICD-10-CM | POA: Insufficient documentation

## 2024-02-06 LAB — CBC
HCT: 31.8 % — ABNORMAL LOW (ref 36.0–46.0)
Hemoglobin: 10 g/dL — ABNORMAL LOW (ref 12.0–15.0)
MCH: 24.5 pg — ABNORMAL LOW (ref 26.0–34.0)
MCHC: 31.4 g/dL (ref 30.0–36.0)
MCV: 77.9 fL — ABNORMAL LOW (ref 80.0–100.0)
Platelets: 135 10*3/uL — ABNORMAL LOW (ref 150–400)
RBC: 4.08 MIL/uL (ref 3.87–5.11)
RDW: 15.8 % — ABNORMAL HIGH (ref 11.5–15.5)
WBC: 7.1 10*3/uL (ref 4.0–10.5)
nRBC: 0 % (ref 0.0–0.2)

## 2024-02-06 NOTE — Anesthesia Preprocedure Evaluation (Signed)
 "                                  Anesthesia Evaluation  Patient identified by MRN, date of birth, ID band Patient awake    Reviewed: Allergy & Precautions, NPO status , Patient's Chart, lab work & pertinent test results  History of Anesthesia Complications Negative for: history of anesthetic complications  Airway Mallampati: II  TM Distance: >3 FB Neck ROM: Full    Dental no notable dental hx. (+) Implants, Teeth Intact   Pulmonary    Pulmonary exam normal breath sounds clear to auscultation       Cardiovascular hypertension, (-) angina (-) Past MI Normal cardiovascular exam Rhythm:Regular Rate:Normal     Neuro/Psych    GI/Hepatic negative GI ROS,,,  Endo/Other  diabetes, Type 2, Oral Hypoglycemic Agents    Renal/GU      Musculoskeletal   Abdominal   Peds  Hematology  (+) Blood dyscrasia, anemia Lab Results      Component                Value               Date                      WBC                      7.1                 02/06/2024                HGB                      10.0 (L)            02/06/2024                HCT                      31.8 (L)            02/06/2024                MCV                      77.9 (L)            02/06/2024                PLT                      135 (L)             02/06/2024              Anesthesia Other Findings   Reproductive/Obstetrics negative OB ROS                              Anesthesia Physical Anesthesia Plan  ASA: 2  Anesthesia Plan: General   Post-op Pain Management: Precedex , Tylenol  PO (pre-op)* and Toradol  IV (intra-op)*   Induction: Intravenous  PONV Risk Score and Plan: 4 or greater and Treatment may vary due to age or medical condition, Midazolam , Dexamethasone  and Ondansetron   Airway Management Planned: Oral ETT  Additional Equipment: None  Intra-op Plan:   Post-operative  Plan: Extubation in OR  Informed Consent: I have reviewed the patients  History and Physical, chart, labs and discussed the procedure including the risks, benefits and alternatives for the proposed anesthesia with the patient or authorized representative who has indicated his/her understanding and acceptance.     Dental advisory given  Plan Discussed with: CRNA and Surgeon  Anesthesia Plan Comments:          Anesthesia Quick Evaluation  "

## 2024-02-07 ENCOUNTER — Encounter (HOSPITAL_COMMUNITY): Payer: Self-pay | Admitting: Anesthesiology

## 2024-02-07 ENCOUNTER — Inpatient Hospital Stay (HOSPITAL_COMMUNITY): Admission: RE | Admit: 2024-02-07 | Source: Home / Self Care | Admitting: Obstetrics and Gynecology

## 2024-02-07 ENCOUNTER — Encounter (HOSPITAL_COMMUNITY): Admission: RE | Payer: Self-pay | Source: Home / Self Care

## 2024-02-07 ENCOUNTER — Encounter (HOSPITAL_COMMUNITY): Payer: Self-pay | Admitting: Obstetrics and Gynecology

## 2024-02-07 DIAGNOSIS — D251 Intramural leiomyoma of uterus: Principal | ICD-10-CM

## 2024-02-07 DIAGNOSIS — D259 Leiomyoma of uterus, unspecified: Secondary | ICD-10-CM | POA: Diagnosis present

## 2024-02-07 HISTORY — DX: Prediabetes: R73.03

## 2024-02-07 LAB — CBC
HCT: 26.4 % — ABNORMAL LOW (ref 36.0–46.0)
Hemoglobin: 8.2 g/dL — ABNORMAL LOW (ref 12.0–15.0)
MCH: 24.3 pg — ABNORMAL LOW (ref 26.0–34.0)
MCHC: 31.1 g/dL (ref 30.0–36.0)
MCV: 78.1 fL — ABNORMAL LOW (ref 80.0–100.0)
Platelets: 119 10*3/uL — ABNORMAL LOW (ref 150–400)
RBC: 3.38 MIL/uL — ABNORMAL LOW (ref 3.87–5.11)
RDW: 15.8 % — ABNORMAL HIGH (ref 11.5–15.5)
WBC: 15 10*3/uL — ABNORMAL HIGH (ref 4.0–10.5)
nRBC: 0 % (ref 0.0–0.2)

## 2024-02-07 LAB — POCT PREGNANCY, URINE: Preg Test, Ur: NEGATIVE

## 2024-02-07 MED ORDER — LACTATED RINGERS IV SOLN: INTRAVENOUS | Status: DC

## 2024-02-07 MED ORDER — DEXMEDETOMIDINE HCL IN NACL 80 MCG/20ML IV SOLN
INTRAVENOUS | Status: DC | PRN
  Administered 2024-02-07 (×2): 8 ug via INTRAVENOUS

## 2024-02-07 MED ORDER — DEXAMETHASONE SOD PHOSPHATE PF 10 MG/ML IJ SOLN
INTRAMUSCULAR | Status: AC
  Filled 2024-02-07: qty 1

## 2024-02-07 MED ORDER — MORPHINE SULFATE (PF) 2 MG/ML IV SOLN
1.0000 mg | INTRAVENOUS | Status: DC | PRN
  Administered 2024-02-07 (×2): 2 mg via INTRAVENOUS
  Filled 2024-02-07 (×2): qty 1

## 2024-02-07 MED ORDER — 0.9 % SODIUM CHLORIDE (POUR BTL) OPTIME
TOPICAL | Status: DC | PRN
  Administered 2024-02-07 (×2): 1000 mL

## 2024-02-07 MED ORDER — MIDAZOLAM HCL 2 MG/2ML IJ SOLN
INTRAMUSCULAR | Status: AC
  Filled 2024-02-07: qty 2

## 2024-02-07 MED ORDER — PROPOFOL 500 MG/50ML IV EMUL
INTRAVENOUS | Status: DC | PRN
  Administered 2024-02-07: 125 ug/kg/min via INTRAVENOUS

## 2024-02-07 MED ORDER — ROCURONIUM BROMIDE 10 MG/ML (PF) SYRINGE
PREFILLED_SYRINGE | INTRAVENOUS | Status: AC
  Filled 2024-02-07: qty 10

## 2024-02-07 MED ORDER — ONDANSETRON HCL 4 MG/2ML IJ SOLN
INTRAMUSCULAR | Status: AC
  Filled 2024-02-07: qty 2

## 2024-02-07 MED ORDER — OXYCODONE HCL 5 MG PO TABS: 5.0000 mg | ORAL_TABLET | Freq: Once | ORAL | Status: DC | PRN

## 2024-02-07 MED ORDER — SODIUM CHLORIDE 0.9 % IV SOLN
INTRAVENOUS | Status: AC
  Filled 2024-02-07: qty 2

## 2024-02-07 MED ORDER — BUPIVACAINE HCL (PF) 0.25 % IJ SOLN
INTRAMUSCULAR | Status: AC
  Filled 2024-02-07: qty 30

## 2024-02-07 MED ORDER — TRANEXAMIC ACID-NACL 1000-0.7 MG/100ML-% IV SOLN
INTRAVENOUS | Status: DC | PRN
  Administered 2024-02-07 (×2): 1000 mg via INTRAVENOUS

## 2024-02-07 MED ORDER — FLUORESCEIN SODIUM 10 % IV SOLN
INTRAVENOUS | Status: AC
  Filled 2024-02-07: qty 5

## 2024-02-07 MED ORDER — POVIDONE-IODINE 10 % EX SWAB: 2.0000 | Freq: Once | CUTANEOUS | Status: DC

## 2024-02-07 MED ORDER — HYDROMORPHONE HCL 1 MG/ML IJ SOLN
INTRAMUSCULAR | Status: AC
  Filled 2024-02-07: qty 0.5

## 2024-02-07 MED ORDER — MENTHOL 3 MG MT LOZG: 1.0000 | LOZENGE | OROMUCOSAL | Status: AC | PRN

## 2024-02-07 MED ORDER — HYDROMORPHONE HCL 1 MG/ML IJ SOLN
0.2500 mg | INTRAMUSCULAR | Status: DC | PRN
  Administered 2024-02-07 (×4): 0.5 mg via INTRAVENOUS

## 2024-02-07 MED ORDER — SODIUM CHLORIDE 0.9 % IR SOLN
Status: DC | PRN
  Administered 2024-02-07: 2000 mL

## 2024-02-07 MED ORDER — HYDROMORPHONE HCL 1 MG/ML IJ SOLN
INTRAMUSCULAR | Status: AC
  Filled 2024-02-07: qty 1

## 2024-02-07 MED ORDER — SODIUM CHLORIDE 0.9 % IV SOLN
500.0000 mg | Freq: Once | INTRAVENOUS | Status: AC
  Administered 2024-02-07: 500 mg via INTRAVENOUS
  Filled 2024-02-07 (×2): qty 25

## 2024-02-07 MED ORDER — KETOROLAC TROMETHAMINE 30 MG/ML IJ SOLN
INTRAMUSCULAR | Status: AC
  Filled 2024-02-07: qty 1

## 2024-02-07 MED ORDER — ACETAMINOPHEN 500 MG PO TABS
1000.0000 mg | ORAL_TABLET | Freq: Four times a day (QID) | ORAL | Status: AC
  Administered 2024-02-07 – 2024-02-08 (×5): 1000 mg via ORAL
  Filled 2024-02-07 (×5): qty 2

## 2024-02-07 MED ORDER — DIPHENHYDRAMINE HCL 12.5 MG/5ML PO ELIX: 12.5000 mg | ORAL_SOLUTION | Freq: Four times a day (QID) | ORAL | Status: AC | PRN

## 2024-02-07 MED ORDER — BUPIVACAINE HCL (PF) 0.25 % IJ SOLN
INTRAMUSCULAR | Status: DC | PRN
  Administered 2024-02-07: 13 mL

## 2024-02-07 MED ORDER — ONDANSETRON HCL 4 MG/2ML IJ SOLN
INTRAMUSCULAR | Status: DC | PRN
  Administered 2024-02-07: 4 mg via INTRAVENOUS

## 2024-02-07 MED ORDER — ALBUMIN HUMAN 5 % IV SOLN: INTRAVENOUS | Status: DC | PRN

## 2024-02-07 MED ORDER — PHENYLEPHRINE HCL-NACL 20-0.9 MG/250ML-% IV SOLN
INTRAVENOUS | Status: DC | PRN
  Administered 2024-02-07: 30 ug/min via INTRAVENOUS

## 2024-02-07 MED ORDER — CHLORHEXIDINE GLUCONATE 0.12 % MT SOLN
15.0000 mL | Freq: Once | OROMUCOSAL | Status: AC
  Administered 2024-02-07: 15 mL via OROMUCOSAL

## 2024-02-07 MED ORDER — MIDAZOLAM HCL 5 MG/5ML IJ SOLN
INTRAMUSCULAR | Status: DC | PRN
  Administered 2024-02-07: 2 mg via INTRAVENOUS

## 2024-02-07 MED ORDER — CHLORHEXIDINE GLUCONATE 0.12 % MT SOLN
OROMUCOSAL | Status: AC
  Filled 2024-02-07: qty 15

## 2024-02-07 MED ORDER — ONDANSETRON HCL 4 MG/2ML IJ SOLN: 4.0000 mg | Freq: Four times a day (QID) | INTRAMUSCULAR | Status: AC | PRN

## 2024-02-07 MED ORDER — FENTANYL CITRATE (PF) 250 MCG/5ML IJ SOLN
INTRAMUSCULAR | Status: AC
  Filled 2024-02-07: qty 5

## 2024-02-07 MED ORDER — NALOXONE HCL 0.4 MG/ML IJ SOLN: 0.4000 mg | INTRAMUSCULAR | Status: AC | PRN

## 2024-02-07 MED ORDER — GABAPENTIN 100 MG PO CAPS
100.0000 mg | ORAL_CAPSULE | Freq: Three times a day (TID) | ORAL | Status: AC
  Administered 2024-02-07 – 2024-02-08 (×5): 100 mg via ORAL
  Filled 2024-02-07 (×5): qty 1

## 2024-02-07 MED ORDER — LIDOCAINE 2% (20 MG/ML) 5 ML SYRINGE
INTRAMUSCULAR | Status: DC | PRN
  Administered 2024-02-07: 100 mg via INTRAVENOUS

## 2024-02-07 MED ORDER — TRANEXAMIC ACID-NACL 1000-0.7 MG/100ML-% IV SOLN: 1000.0000 mg | Freq: Once | INTRAVENOUS | Status: DC

## 2024-02-07 MED ORDER — DIPHENHYDRAMINE HCL 50 MG/ML IJ SOLN: 12.5000 mg | Freq: Four times a day (QID) | INTRAMUSCULAR | Status: AC | PRN

## 2024-02-07 MED ORDER — OXYCODONE HCL 5 MG/5ML PO SOLN: 5.0000 mg | Freq: Once | ORAL | Status: DC | PRN

## 2024-02-07 MED ORDER — SENNOSIDES 8.8 MG/5ML PO SYRP: 10.0000 mL | ORAL_SOLUTION | Freq: Every day | ORAL | Status: DC

## 2024-02-07 MED ORDER — ORAL CARE MOUTH RINSE: 15.0000 mL | Freq: Once | OROMUCOSAL | Status: AC

## 2024-02-07 MED ORDER — PHENYLEPHRINE 80 MCG/ML (10ML) SYRINGE FOR IV PUSH (FOR BLOOD PRESSURE SUPPORT)
PREFILLED_SYRINGE | INTRAVENOUS | Status: AC
  Filled 2024-02-07: qty 10

## 2024-02-07 MED ORDER — ACETAMINOPHEN 500 MG PO TABS
ORAL_TABLET | ORAL | Status: AC
  Filled 2024-02-07: qty 2

## 2024-02-07 MED ORDER — ROCURONIUM BROMIDE 100 MG/10ML IV SOLN
INTRAVENOUS | Status: DC | PRN
  Administered 2024-02-07: 30 mg via INTRAVENOUS
  Administered 2024-02-07: 20 mg via INTRAVENOUS
  Administered 2024-02-07: 30 mg via INTRAVENOUS
  Administered 2024-02-07: 50 mg via INTRAVENOUS

## 2024-02-07 MED ORDER — TRANEXAMIC ACID-NACL 1000-0.7 MG/100ML-% IV SOLN
INTRAVENOUS | Status: AC
  Filled 2024-02-07: qty 100

## 2024-02-07 MED ORDER — LACTATED RINGERS IV SOLN: INTRAVENOUS | Status: AC

## 2024-02-07 MED ORDER — LACTATED RINGERS IV SOLN: INTRAVENOUS | Status: DC | PRN

## 2024-02-07 MED ORDER — SUGAMMADEX SODIUM 200 MG/2ML IV SOLN
INTRAVENOUS | Status: DC | PRN
  Administered 2024-02-07: 200 mg via INTRAVENOUS

## 2024-02-07 MED ORDER — SODIUM CHLORIDE 0.9 % IV SOLN
2.0000 g | INTRAVENOUS | Status: AC
  Administered 2024-02-07 (×2): 2 g via INTRAVENOUS

## 2024-02-07 MED ORDER — PHENYLEPHRINE 80 MCG/ML (10ML) SYRINGE FOR IV PUSH (FOR BLOOD PRESSURE SUPPORT)
PREFILLED_SYRINGE | INTRAVENOUS | Status: DC | PRN
  Administered 2024-02-07: 160 ug via INTRAVENOUS
  Administered 2024-02-07: 120 ug via INTRAVENOUS
  Administered 2024-02-07 (×2): 80 ug via INTRAVENOUS

## 2024-02-07 MED ORDER — SODIUM CHLORIDE 0.9 % IV SOLN
2.0000 g | Freq: Once | INTRAVENOUS | Status: DC
  Filled 2024-02-07: qty 2

## 2024-02-07 MED ORDER — ONDANSETRON HCL 4 MG/2ML IJ SOLN: 4.0000 mg | Freq: Once | INTRAMUSCULAR | Status: DC | PRN

## 2024-02-07 MED ORDER — SENNA 8.6 MG PO TABS
1.0000 | ORAL_TABLET | Freq: Two times a day (BID) | ORAL | Status: AC
  Administered 2024-02-07 – 2024-02-08 (×3): 8.6 mg via ORAL
  Filled 2024-02-07 (×3): qty 1

## 2024-02-07 MED ORDER — ACETAMINOPHEN 500 MG PO TABS
1000.0000 mg | ORAL_TABLET | Freq: Once | ORAL | Status: AC
  Administered 2024-02-07: 1000 mg via ORAL

## 2024-02-07 MED ORDER — ONDANSETRON HCL 4 MG PO TABS: 4.0000 mg | ORAL_TABLET | Freq: Four times a day (QID) | ORAL | Status: AC | PRN

## 2024-02-07 MED ORDER — MORPHINE SULFATE 1 MG/ML IV SOLN PCA
INTRAVENOUS | Status: AC
  Administered 2024-02-08: 4.5 mg via INTRAVENOUS
  Filled 2024-02-07: qty 30

## 2024-02-07 MED ORDER — KETOROLAC TROMETHAMINE 30 MG/ML IJ SOLN: 30.0000 mg | Freq: Once | INTRAMUSCULAR | Status: DC | PRN

## 2024-02-07 MED ORDER — HYDROMORPHONE HCL 1 MG/ML IJ SOLN
INTRAMUSCULAR | Status: DC | PRN
  Administered 2024-02-07 (×2): .5 mg via INTRAVENOUS

## 2024-02-07 MED ORDER — OXYCODONE HCL 5 MG PO TABS
5.0000 mg | ORAL_TABLET | ORAL | Status: AC | PRN
  Administered 2024-02-08 (×3): 10 mg via ORAL
  Filled 2024-02-07 (×3): qty 2

## 2024-02-07 MED ORDER — IBUPROFEN 600 MG PO TABS
600.0000 mg | ORAL_TABLET | Freq: Four times a day (QID) | ORAL | Status: AC
  Administered 2024-02-07 – 2024-02-08 (×5): 600 mg via ORAL
  Filled 2024-02-07 (×5): qty 1

## 2024-02-07 MED ORDER — HEMOSTATIC AGENTS (NO CHARGE) OPTIME
TOPICAL | Status: DC | PRN
  Administered 2024-02-07: 1 via TOPICAL

## 2024-02-07 MED ORDER — FENTANYL CITRATE (PF) 100 MCG/2ML IJ SOLN
INTRAMUSCULAR | Status: DC | PRN
  Administered 2024-02-07 (×5): 50 ug via INTRAVENOUS

## 2024-02-07 MED ORDER — PANTOPRAZOLE SODIUM 40 MG PO TBEC
40.0000 mg | DELAYED_RELEASE_TABLET | Freq: Every day | ORAL | Status: AC
  Administered 2024-02-07: 40 mg via ORAL
  Filled 2024-02-07 (×2): qty 1

## 2024-02-07 MED ORDER — PROPOFOL 10 MG/ML IV BOLUS
INTRAVENOUS | Status: DC | PRN
  Administered 2024-02-07: 170 ug via INTRAVENOUS
  Administered 2024-02-07: 30 ug via INTRAVENOUS

## 2024-02-07 MED ORDER — ALUM & MAG HYDROXIDE-SIMETH 200-200-20 MG/5ML PO SUSP: 30.0000 mL | ORAL | Status: AC | PRN

## 2024-02-07 MED ORDER — LIDOCAINE 2% (20 MG/ML) 5 ML SYRINGE
INTRAMUSCULAR | Status: AC
  Filled 2024-02-07: qty 5

## 2024-02-07 MED ORDER — DEXAMETHASONE SOD PHOSPHATE PF 10 MG/ML IJ SOLN
INTRAMUSCULAR | Status: DC | PRN
  Administered 2024-02-07: 10 mg via INTRAVENOUS

## 2024-02-07 MED ORDER — SODIUM CHLORIDE 0.9% FLUSH: 9.0000 mL | INTRAVENOUS | Status: AC | PRN

## 2024-02-07 NOTE — Brief Op Note (Signed)
 OR Date: 02/07/2024 OR Patient Start Time:  9:00 AM  1:24 PM  PATIENT:  Eileen Patel  44 y.o. female  PRE-OPERATIVE DIAGNOSIS:  Fibroid, menomtrorrhagia  POST-OPERATIVE DIAGNOSIS:  fibroid, menomtrorrhagia  PROCEDURE:  Procedures: HYSTERECTOMY, TOTAL, LAPAROSCOPIC, WITH SALPINGECTOMY exploratory laparotomy lysis of adhesions (Bilateral) CYSTOSCOPY (N/A)  SURGEON:  Surgeons and Role:    * Cindia Hustead, Ted Morrison, DO - Primary    * Lovick, Dreama LOISE, MD - Assisting    * Claudene Larraine LABOR, DO - Assisting  PHYSICIAN ASSISTANT:    ANESTHESIA:   local and regional  EBL:  1140 mL   BLOOD ADMINISTERED:none  DRAINS: Urinary Catheter (Foley)   LOCAL MEDICATIONS USED:  LIDOCAINE    SPECIMEN:  Source of Specimen:  uterus, cervix fallopian tube  DISPOSITION OF SPECIMEN:  PATHOLOGY  COUNTS:  YES  TOURNIQUET:  * No tourniquets in log *  DICTATION: .Note written in EPIC  PLAN OF CARE: Admit to inpatient   PATIENT DISPOSITION:  PACU - hemodynamically stable.   Delay start of Pharmacological VTE agent (>24hrs) due to surgical blood loss or risk of bleeding: yes

## 2024-02-07 NOTE — Op Note (Signed)
 Operative Note    Preoperative Diagnosis Abnormal uterine bleeding Leiomyomas Pelvic pain History of previous abdominal surgeries ( myomectomy x 2 and cesarean section x 1)     Postoperative Diagnosis: Same  5. Extensive adhesions of bowel to uterus and uterus to anterior, lateral and posterior abdominal wall   Procedure: Diagnostic laparoscopy with lysis of adhesions and laparoscopic assisted total abdominal hysterectomy with unilateral salpingectomy and cystoscopy    Surgeon: Delana BROCKS DO  Surgeon: Paola LABOR MD  Assist: Claudene POUR DO   Anesthesia: General and local  Fluids: LR  EBL: UOP:  Meds: cefotetan  x 2 and tranexamic acid  x 2  Findings: Diffusely distorted uterus due to multiple subserosal and intramural fibroids, dense anterior adhesions of uterus to abdominal wall, ovaries and tubes to lateral sidewalls, an apron of omental adhesions to over fundus of uterus and mild adhesions posteriorly in the cul de sac. Uterus measured approximately 10-12cm in length    Specimen: Uterus, cervix, right fallopian tube stump and portion of left fallopian tube to pathology    Procedure Note  Pt seen in pre-op area. Procedure and expectation reviewed and all questions answered; consent verified  Pt was taken to the operating room. She was assessed and general anesthesia safely administered.  Pt was then placed in the dorsal lithotomy position with her arms safely positioned at her sides.  Pt was prepped and draped in sterile fashion and a timeout performed.  An exam under anesthesia noted an anteverted uterus. A speculum was placed and the anterior lip of the cervix grasped with a single toothed tenaculum.  The uterus was sounded to 8cm so a size 8 koh was assembled with a small cup. Retention sutures were placed at 3 and 9 o'clock. A foley catheter then also placed in a sterile fashion Her legs were then lowered and attention turned to her abdomen.  Here a 5mm incision  was made at the umbilicus after injecting with 0.25% plain marcaine . A 5mm optiview trocar and camera were then placed with the abdomen tented upwards. The placement was confirmed to be appropriate with pneumoperitoneum attained with CO2 gas to . The patient was placed in trendelenberg and gross survey of pelvis done. Upon initial entry, survey made note of dense adhesions of uterus to the anterior abdominal wall.   At this time a 5mm port was then placed under direct visualization in right lower quadrant and an 11mm port site in the left. Care was taken to avoid the epigastric vessels. A more thorough evaluation was performed. Attempt made to visualize ureters along the sidewall was difficult due to adhesions of uterus and adnexa on both sides to the lateral sidewalls   Starting anteriorly, the adhesions were gradually taken down using the harmonic scalpel. Once this was accomplished, the lower uterine segment was better visualized and decision made to continue laparoscopically. However at this time a dense apron of omental adhesions was noted to be covering the fundal posterior part of the uterus. A call was made for a general surgeon to come in to assist in this adhesiolysis  While waiting, attention was turned to the patients right side. Here, the right fallopian tube stump was located,  grasped, elevated and excised using the harmonic scalpel hemostatically. Next the utero-ovarian ligament was excised. Bleeding was cauterized and the ovary finally freed. The ureter was visualized at this time and care taken to avoid it.   Dr Paola presented at this time and performed a careful adhesiolyses. Please  see her note for details of procedure. Hemostasis was noted at the end of her case and no bowel contents visualized,  Turning attention back to the right side, the round ligament was grasped and excised and again, with care taken to avoid the ureter, the broad ligament was then separated from the  uterus sequentially to just above the utero-cervical junction.  At this point adhesions were too dense for safe continuation as the architecture of the anatomic planes was blurred.  The left side of the uterus and adnexa were similarly difficult to visualize thus decision was made to continue the case in an open fashion.  A pfannestiall incision was then made through the previous incision and carried through to the fascia. The fascia was incised and extended laterally. The anterior and posterior aspects of the fascia were grasped with kocher clamps, elevated and the underlying rectus muscles dissected off.  The rectus muscles were densely adhesed thus, care was taken to safely  separate them in the midline with the aid of hemostats  Once the underlying peritoneum was entered and the abdominal cavity exposed, the left adnexa was better examined and adhesions excised freeing the ovary. The fallopian tube was however noted to be quite stretched thin rendering it difficult to separate from the cystic ovary.  An alexis was then placed with better visualization attained.   The hysterectomy then proceeded with the left utero-ovarian ligament and then the round ligament on the left side clamped, excised and suture ligated using 0-vicryl pop offs.  The broad ligament on both sides of the uterus were progressively clamped, excised and suture ligated. A bladder flap was created and the uterine vessels located, clamped and ligated hemostatically on both sides.   Due to the bulky nature of the uterus at this time, a supracervical approach was used. The uterus was amputated leaving the cervical stump. With better visualization, the surgery proceeded with the excision of the cervical stump. Care was again taken at this time to avoid bladder injury.  There was an area of bleeding at the left corner of the vaginal opening. This was suture ligated twice with hemostasis eventually noted. A hemostatic powder was  liberally applied to effect more hemostasis as the bleeding had been mostly oozy.  The vaginal opening was then closed in a running locked fashion also using 0-vicryl suture. Great tensile strength appreciated. Plication was not needed  Copious irrigation of the pelvis confirmed no bleeding. The patient was  then flattened.  The alexis was removed and the peritoneum closed in a running fashion. The fascia was closed using o vicryl suture from both ends. The subcutaneous fat was also reapproximated and finally the skin incisions closed using a 4-0 vicryl on a keith for the pfannestial incision which then had benzoin, steristrips and a honeycomb dressing applied, and 4-0 vicry suture on a curved needle for the trocar sites with dermabond  A deep stitch was first placed in the 11mm port site before closure was effected.    Next, a cystoscopy was performed.  Ureteral jets were easily noted from both ureters.The cystoscopy was completed and foley catheter replaced.  All instrument and sponge counts were noted to be accurate per nursing staff.  Patient was awakened and taken to recovery room in stable status.

## 2024-02-07 NOTE — Anesthesia Procedure Notes (Signed)
 Procedure Name: Intubation Date/Time: 02/07/2024 9:21 AM  Performed by: Denton Niels CROME, CRNAPre-anesthesia Checklist: Patient identified, Emergency Drugs available, Suction available and Patient being monitored Patient Re-evaluated:Patient Re-evaluated prior to induction Oxygen Delivery Method: Circle system utilized Preoxygenation: Pre-oxygenation with 100% oxygen Induction Type: IV induction Ventilation: Mask ventilation without difficulty Laryngoscope Size: Mac and 3 Grade View: Grade II Tube type: Oral Number of attempts: 1 Airway Equipment and Method: Stylet Placement Confirmation: ETT inserted through vocal cords under direct vision, positive ETCO2 and breath sounds checked- equal and bilateral Secured at: 21.5 cm Tube secured with: Tape Dental Injury: Teeth and Oropharynx as per pre-operative assessment

## 2024-02-07 NOTE — Progress Notes (Signed)
 Pt seen  She reports diffuse abdominal pain as expected . Pain relieved by meds but would feel better with PCA She denies nausea, fever, CP or SOB.  She tolerated sips of fluid and is eager for trial of ambulation tonight.   BP (!) 147/85   Pulse 87   Temp 98.2 F (36.8 C) (Oral)   Resp 17   Ht 5' 3 (1.6 m)   Wt 93 kg   LMP 01/17/2024 (Exact Date)   SpO2 99%   BMI 36.31 kg/m    GEN - In mild distress but overall looks well ABD - dressings c/d/I EXT - SCDs in place  GU - foley draining   Results for orders placed or performed during the hospital encounter of 02/07/24 (from the past 24 hours)  Pregnancy, urine POC     Status: None   Collection Time: 02/07/24  7:30 AM  Result Value Ref Range   Preg Test, Ur NEGATIVE NEGATIVE  CBC     Status: Abnormal   Collection Time: 02/07/24  5:02 PM  Result Value Ref Range   WBC 15.0 (H) 4.0 - 10.5 K/uL   RBC 3.38 (L) 3.87 - 5.11 MIL/uL   Hemoglobin 8.2 (L) 12.0 - 15.0 g/dL   HCT 73.5 (L) 63.9 - 53.9 %   MCV 78.1 (L) 80.0 - 100.0 fL   MCH 24.3 (L) 26.0 - 34.0 pg   MCHC 31.1 30.0 - 36.0 g/dL   RDW 84.1 (H) 88.4 - 84.4 %   Platelets 119 (L) 150 - 400 K/uL   nRBC 0.0 0.0 - 0.2 %   A/P POD#0 s/p Total abdominal hysterectomy with salpingectomy, diagnostic laparoscopy and lysis of adhesions - stable  - Pain control : will start on a morphine  PCA pump overnight - stop at 730am - Mild anemia of acute blood loss - discussed one dose of venofer  and pt agrees to this  - Foley draining well - remove in am  - Routine post op care - ambulation tonight

## 2024-02-07 NOTE — Op Note (Signed)
" ° °  Operative Note   Date: 02/07/2024  Procedure: laparoscopic adhesiolysis  Indication and clinical history: The patient is a 44 y.o. year old female undergoing laparoscopic hysterectomy. I was consulted intra-operatively for assistance with adhesiolysis     Surgeon: Dreama GEANNIE Hanger, MD  Anesthesiologist: Jefm, MD Anesthesia: General  Description of procedure: The patient was intubated, positioned, and abdominal entry obtained prior to my involvement. I was consulted intra-operatively to assist with adhesiolysis of the colon from the posterior aspect of the uterus. Adhesiolysis was performed using a combination of electrocautery and sharp dissection. No injury to the colon was visualized during my time in the OR. The case was turned back over to Dr. Delana.    Dreama GEANNIE Hanger, MD General and Trauma Surgery [facility]  "

## 2024-02-07 NOTE — H&P (Signed)
 Eileen Patel is an 44 y.o. [phone] female here for scheduled surgery. She is set for a due to heavy and irregular menstrual bleeding from fibroids.  She has had a myomectomy in 10/2012 and again 08/2019.  Passing large clots and cycle lasting up to a week. Back on iron  supps. Feels fatigued and has pelvic pain. No longer desires to carry a child. TXA rx not helping this time ; feels tired and miserable   Hx right salpingectomy 2010 . Had recanalization of left fallopian tube. 2016.   She has hypothyroidism ( no meds) and T2DM .  She complains of low back pain  Pertinent Gynecological History: Menses: irregular and heavy Bleeding: dysfunctional uterine bleeding Contraception: none DES exposure: denies Blood transfusions: none Sexually transmitted diseases: no past history Previous GYN Procedures: myomectomy  Last mammogram: n/a Date:   Last pap: normal Date: 01/23/19 OB History: G7, P1061   Menstrual History: Menarche age: 26 Patient's last menstrual period was 01/17/2024 (exact date).    Past Medical History:  Diagnosis Date   Anemia    Migraine    Pre-diabetes    Vaginal fibroids    pt reports hx of ABD pain with  fibroids    Past Surgical History:  Procedure Laterality Date   CESAREAN SECTION  2008   CHROMOPERTUBATION N/A 08/04/2019   Procedure: CHROMOPERTUBATION;  Surgeon: Delana Ted Morrison, DO;  Location: [facility] OR;  Service: Gynecology;  Laterality: N/A;  possible   DIAGNOSTIC LAPAROSCOPY     ectopic preg   LYSIS OF ADHESION  08/04/2019   Procedure: EXTENSIVE LYSIS OF ADHESIONS;  Surgeon: Delana Ted Morrison, DO;  Location: [facility] OR;  Service: Gynecology;;   MYOMECTOMY N/A 08/04/2019   Procedure: ABDOMINAL MYOMECTOMY;  Surgeon: Delana Ted Morrison, DO;  Location: [facility] OR;  Service: Gynecology;  Laterality: N/A;   ROBOT ASSISTED MYOMECTOMY N/A 10/10/2012   Procedure: ROBOTIC ASSISTED MYOMECTOMY WITH CHRMOPER TUBATION;  Surgeon: Nena DELENA App, MD;  Location: [facility] ORS;   Service: Gynecology;  Laterality: N/A;   WISDOM TOOTH EXTRACTION      History reviewed. No pertinent family history.  Social History:  reports that she has never smoked. She has never used smokeless tobacco. She reports that she does not drink alcohol and does not use drugs.  Allergies: Allergies[1]  Medications Prior to Admission  Medication Sig Dispense Refill Last Dose/Taking   Semaglutide (OZEMPIC, 2 MG/DOSE, Tabiona) Inject 2 mg into the skin once a week.   01/15/2024   ferrous sulfate  325 (65 FE) MG tablet Take 1 tablet (325 mg total) by mouth 2 (two) times daily with a meal. 60 tablet 6 02/05/2024   promethazine  (PHENERGAN ) 25 MG tablet Take 1 tablet (25 mg total) by mouth every 8 (eight) hours as needed for nausea or vomiting. (Patient not taking: Reported on 12/04/2022) 12 tablet 0 Unknown    Review of Systems  Constitutional:  Positive for activity change and fatigue.  Eyes:  Negative for photophobia and visual disturbance.  Respiratory:  Negative for chest tightness and shortness of breath.   Cardiovascular:  Negative for chest pain, palpitations and leg swelling.  Gastrointestinal:  Positive for abdominal pain.  Genitourinary:  Positive for pelvic pain and vaginal bleeding.  Musculoskeletal:  Positive for back pain.  Neurological:  Negative for light-headedness and headaches.  Psychiatric/Behavioral:  The patient is nervous/anxious.     Blood pressure (!) 179/107, pulse 90, temperature 98.4 F (36.9 C), temperature source Oral, resp. rate 16, height 5' 3 (1.6 m),  weight 93 kg, last menstrual period 01/17/2024, SpO2 100%.   Results for orders placed or performed during the hospital encounter of 02/07/24 (from the past 24 hours)  Pregnancy, urine POC     Status: None   Collection Time: 02/07/24  7:30 AM  Result Value Ref Range   Preg Test, Ur NEGATIVE NEGATIVE    No results found.  Assessment/Plan: 58bn [id number] female here for total laparoscopic hysterectomy with  bilateral salpingectomy, possible open, and cystoscopy  - Admit - ERAS protocol - Consent confirmed and r/b reiterated - To OR when ready   Tenelle Andreason W Jakie Debow 02/07/2024, 8:43 AM     [1] No Known Allergies

## 2024-02-07 NOTE — Anesthesia Postprocedure Evaluation (Signed)
"   Anesthesia Post Note  Patient: Eileen Patel  Procedure(s) Performed: HYSTERECTOMY, TOTAL, LAPAROSCOPIC, WITH SALPINGECTOMY exploratory laparotomy lysis of adhesions (Bilateral: Abdomen) CYSTOSCOPY     Patient location during evaluation: PACU Anesthesia Type: General Level of consciousness: awake and alert Pain management: pain level controlled Vital Signs Assessment: post-procedure vital signs reviewed and stable Respiratory status: spontaneous breathing, nonlabored ventilation, respiratory function stable and patient connected to nasal cannula oxygen Cardiovascular status: blood pressure returned to baseline and stable Postop Assessment: no apparent nausea or vomiting Anesthetic complications: no   No notable events documented.  Last Vitals:  Vitals:   02/07/24 1652 02/07/24 1811  BP: (!) 146/88 (!) 141/88  Pulse: 83 81  Resp: 15 17  Temp: 36.8 C   SpO2: 100% 99%    Last Pain:  Vitals:   02/07/24 1720  TempSrc:   PainSc: 10-Worst pain ever                 Garnette DELENA Gab      "

## 2024-02-07 NOTE — Transfer of Care (Signed)
 Immediate Anesthesia Transfer of Care Note  Patient: Eileen Patel  Procedure(s) Performed: HYSTERECTOMY, TOTAL, LAPAROSCOPIC, WITH SALPINGECTOMY exploratory laparotomy lysis of adhesions (Bilateral: Abdomen) CYSTOSCOPY  Patient Location: PACU  Anesthesia Type:General  Level of Consciousness: awake and patient cooperative  Airway & Oxygen Therapy: Patient Spontanous Breathing and Patient connected to face mask oxygen  Post-op Assessment: Report given to RN and Post -op Vital signs reviewed and stable  Post vital signs: Reviewed and stable  Last Vitals:  Vitals Value Taken Time  BP 156/91 02/07/24 13:45  Temp 36.9 C 02/07/24 13:49  Pulse 81 02/07/24 13:49  Resp 18 02/07/24 13:49  SpO2 100 % 02/07/24 13:49  Vitals shown include unfiled device data.  Last Pain:  Vitals:   02/07/24 0743  TempSrc: Oral  PainSc: 0-No pain      Patients Stated Pain Goal: 5 (02/07/24 0743)  Complications: No notable events documented.

## 2024-02-08 ENCOUNTER — Encounter (HOSPITAL_COMMUNITY): Payer: Self-pay | Admitting: Obstetrics and Gynecology

## 2024-02-08 ENCOUNTER — Inpatient Hospital Stay (HOSPITAL_COMMUNITY)

## 2024-02-08 LAB — CBC WITH DIFFERENTIAL/PLATELET
Abs Immature Granulocytes: 0.04 10*3/uL (ref 0.00–0.07)
Basophils Absolute: 0.1 10*3/uL (ref 0.0–0.1)
Basophils Relative: 1 %
Eosinophils Absolute: 0 10*3/uL (ref 0.0–0.5)
Eosinophils Relative: 0 %
HCT: 20.6 % — ABNORMAL LOW (ref 36.0–46.0)
Hemoglobin: 6.6 g/dL — CL (ref 12.0–15.0)
Immature Granulocytes: 0 %
Lymphocytes Relative: 20 %
Lymphs Abs: 2.1 10*3/uL (ref 0.7–4.0)
MCH: 24.5 pg — ABNORMAL LOW (ref 26.0–34.0)
MCHC: 32 g/dL (ref 30.0–36.0)
MCV: 76.6 fL — ABNORMAL LOW (ref 80.0–100.0)
Monocytes Absolute: 0.9 10*3/uL (ref 0.1–1.0)
Monocytes Relative: 8 %
Neutro Abs: 7.5 10*3/uL (ref 1.7–7.7)
Neutrophils Relative %: 71 %
Platelets: 111 10*3/uL — ABNORMAL LOW (ref 150–400)
RBC: 2.69 MIL/uL — ABNORMAL LOW (ref 3.87–5.11)
RDW: 15.8 % — ABNORMAL HIGH (ref 11.5–15.5)
WBC: 10.5 10*3/uL (ref 4.0–10.5)
nRBC: 0 % (ref 0.0–0.2)

## 2024-02-08 LAB — CBC
HCT: 22.6 % — ABNORMAL LOW (ref 36.0–46.0)
Hemoglobin: 7.3 g/dL — ABNORMAL LOW (ref 12.0–15.0)
MCH: 24.7 pg — ABNORMAL LOW (ref 26.0–34.0)
MCHC: 32.3 g/dL (ref 30.0–36.0)
MCV: 76.6 fL — ABNORMAL LOW (ref 80.0–100.0)
Platelets: 108 10*3/uL — ABNORMAL LOW (ref 150–400)
RBC: 2.95 MIL/uL — ABNORMAL LOW (ref 3.87–5.11)
RDW: 15.8 % — ABNORMAL HIGH (ref 11.5–15.5)
WBC: 10.8 10*3/uL — ABNORMAL HIGH (ref 4.0–10.5)
nRBC: 0 % (ref 0.0–0.2)

## 2024-02-08 LAB — TYPE AND SCREEN
ABO/RH(D): AB NEG
Antibody Screen: NEGATIVE
Unit division: 0
Unit division: 0
Unit division: 0

## 2024-02-08 LAB — COMPREHENSIVE METABOLIC PANEL WITH GFR
ALT: 19 U/L (ref 0–44)
AST: 23 U/L (ref 15–41)
Albumin: 3.5 g/dL (ref 3.5–5.0)
Alkaline Phosphatase: 50 U/L (ref 38–126)
Anion gap: 10 (ref 5–15)
BUN: 9 mg/dL (ref 6–20)
CO2: 24 mmol/L (ref 22–32)
Calcium: 8.1 mg/dL — ABNORMAL LOW (ref 8.9–10.3)
Chloride: 108 mmol/L (ref 98–111)
Creatinine, Ser: 0.79 mg/dL (ref 0.44–1.00)
GFR, Estimated: >60 mL/min
Glucose, Bld: 128 mg/dL — ABNORMAL HIGH (ref 70–99)
Potassium: 3.6 mmol/L (ref 3.5–5.1)
Sodium: 142 mmol/L (ref 135–145)
Total Bilirubin: 1.6 mg/dL — ABNORMAL HIGH (ref 0.0–1.2)
Total Protein: 6.3 g/dL — ABNORMAL LOW (ref 6.5–8.1)

## 2024-02-08 LAB — PREPARE RBC (CROSSMATCH)

## 2024-02-08 LAB — BPAM RBC
Blood Product Expiration Date: 202602122359
Blood Product Expiration Date: 202602122359
Blood Product Expiration Date: 202602122359
ISSUE DATE / TIME: 202602061523
ISSUE DATE / TIME: 202602061849
ISSUE DATE / TIME: 202602062212
Unit Type and Rh: 600
Unit Type and Rh: 600
Unit Type and Rh: 600

## 2024-02-08 MED ORDER — SODIUM CHLORIDE 0.9% IV SOLUTION: Freq: Once | INTRAVENOUS | Status: AC

## 2024-02-08 MED ORDER — ACETAMINOPHEN 325 MG PO TABS: 650.0000 mg | ORAL_TABLET | Freq: Once | ORAL | Status: DC

## 2024-02-08 MED ORDER — HYDROMORPHONE HCL 1 MG/ML PO LIQD: 1.0000 mg | ORAL | Status: DC | PRN

## 2024-02-08 MED ORDER — FUROSEMIDE 10 MG/ML IJ SOLN
20.0000 mg | Freq: Once | INTRAMUSCULAR | Status: AC
  Administered 2024-02-08: 20 mg via INTRAVENOUS
  Filled 2024-02-08: qty 2

## 2024-02-08 MED ORDER — SIMETHICONE 80 MG PO CHEW
80.0000 mg | CHEWABLE_TABLET | Freq: Four times a day (QID) | ORAL | Status: AC | PRN
  Administered 2024-02-08 (×2): 80 mg via ORAL
  Filled 2024-02-08 (×2): qty 1

## 2024-02-08 MED ORDER — DIPHENHYDRAMINE HCL 25 MG PO CAPS
25.0000 mg | ORAL_CAPSULE | Freq: Once | ORAL | Status: AC
  Administered 2024-02-08: 25 mg via ORAL
  Filled 2024-02-08: qty 1

## 2024-02-08 MED ORDER — HYDROMORPHONE HCL 1 MG/ML IJ SOLN
1.0000 mg | INTRAMUSCULAR | Status: AC | PRN
  Administered 2024-02-08 (×2): 1 mg via INTRAVENOUS
  Filled 2024-02-08 (×2): qty 1

## 2024-02-08 NOTE — Progress Notes (Signed)
 Patient ID: Eileen Patel, female   DOB: 09/20/1980, 44 y.o.   MRN: [id number]  Pt reports 7/10 pain when laying still. Gets dizzy and pain increases with attempt at ambulation.  No fever or chills. Tolerated PO with no nausea. Also has referred right shoulder pain She reports minimal pain relief with morphine   BP (!) 151/74 (BP Location: Right Arm)   Pulse (!) 105   Temp 98.5 F (36.9 C) (Oral)   Resp (!) 22   Ht 5' 3 (1.6 m)   Wt 93 kg   LMP 01/17/2024 (Exact Date)   SpO2 95%   BMI 36.31 kg/m    GEN - laying still, no acute distress ABD - tympanic, mild distension but soft  EXT - scds in place  GU - foley in place   Results for orders placed or performed during the hospital encounter of 02/07/24 (from the past 24 hours)  CBC     Status: Abnormal   Collection Time: 02/07/24  5:02 PM  Result Value Ref Range   WBC 15.0 (H) 4.0 - 10.5 K/uL   RBC 3.38 (L) 3.87 - 5.11 MIL/uL   Hemoglobin 8.2 (L) 12.0 - 15.0 g/dL   HCT 73.5 (L) 63.9 - 53.9 %   MCV 78.1 (L) 80.0 - 100.0 fL   MCH 24.3 (L) 26.0 - 34.0 pg   MCHC 31.1 30.0 - 36.0 g/dL   RDW 84.1 (H) 88.4 - 84.4 %   Platelets 119 (L) 150 - 400 K/uL   nRBC 0.0 0.0 - 0.2 %  CBC     Status: Abnormal   Collection Time: 02/08/24  6:39 AM  Result Value Ref Range   WBC 10.8 (H) 4.0 - 10.5 K/uL   RBC 2.95 (L) 3.87 - 5.11 MIL/uL   Hemoglobin 7.3 (L) 12.0 - 15.0 g/dL   HCT 77.3 (L) 63.9 - 53.9 %   MCV 76.6 (L) 80.0 - 100.0 fL   MCH 24.7 (L) 26.0 - 34.0 pg   MCHC 32.3 30.0 - 36.0 g/dL   RDW 84.1 (H) 88.4 - 84.4 %   Platelets 108 (L) 150 - 400 K/uL   nRBC 0.0 0.0 - 0.2 %   A/P: POD#1 s/p hysterectomy and adhesiolysis - stable  - Will switch pain meds from morphine  to dilaudid  for breakthrough pain  - Encourage ambulation today  - Routine ERAS post op care _ Anticipate discharge home tomorrow

## 2024-02-08 NOTE — Progress Notes (Addendum)
 1 Day Post-Op Procedures (LRB): HYSTERECTOMY, TOTAL, LAPAROSCOPIC, WITH SALPINGECTOMY exploratory laparotomy lysis of adhesions (Bilateral) CYSTOSCOPY (N/A)  Subjective: Patient reports tolerating PO. No flatus, voiding well. Diffuse abdominal pain not well controlled with oxycodone . Has not tried dilaudid  yet. She has tolerated first bag of prbcs with no side effects. Feels fatigued   Objective: I have reviewed patient's vital signs, intake and output, medications, and labs.  BP 126/76   Pulse (!) 103   Temp 98.5 F (36.9 C) (Oral)   Resp 18   Ht 5' 3 (1.6 m)   Wt 93 kg   LMP 01/17/2024 (Exact Date)   SpO2 94%   BMI 36.31 kg/m    General: alert, cooperative, fatigued, and mild distress GI: incision: clean, dry, and intact and abdomen soft, mild distension , + BS on right side and none on left , Tolerates palpation Extremities: Homans sign is negative, no sign of DVT Vaginal Bleeding: none  Assessment: s/p Procedures: HYSTERECTOMY, TOTAL, LAPAROSCOPIC, WITH SALPINGECTOMY exploratory laparotomy lysis of adhesions (Bilateral) CYSTOSCOPY (N/A): tolerating diet, ileus present, anemia, and pain not adequately controlled.   Plan: 1 1 mg PO dilaudid  now and continue for breakthrough pain if persists 2. Continue prbc infusion - on to second bag 3. H/H 4 hrs after complete third bag blood 4. Hypoxic - VT chest and abdomen ordered  4. Encourage ambulation after completes transfusion   LOS: 1 day    Ellaina Schuler W Marley Charlot, DO 02/08/2024, 5:59 PM

## 2024-02-08 NOTE — Progress Notes (Signed)
 Patient ID: Eileen Patel, female   DOB: May 05, 1980, 44 y.o.   MRN: [id number] Pt Hg returned at 6.6  Abdomen still soft, ND,   BP and HR stable  Plan to transfuse 2-3 units of blood.  Pt consents to this  Order placed. Will recheck cbc 4 hrs after last unit complete

## 2024-02-08 NOTE — Progress Notes (Signed)
 12 mg of 1 mg/mL morphine  wasted in stericycle waste bin with Delon Ahle, RN

## 2024-02-09 MED ORDER — IOHEXOL 350 MG/ML SOLN
75.0000 mL | Freq: Once | INTRAVENOUS | Status: AC | PRN
  Administered 2024-02-09: 75 mL via INTRAVENOUS
